# Patient Record
Sex: Male | Born: 1975 | Race: White | Hispanic: No | Smoking: Current every day smoker
Health system: Southern US, Community
[De-identification: ages and names within clinical notes are randomized; demographics above are authoritative.]

## PROBLEM LIST (undated history)

## (undated) DIAGNOSIS — R450 Nervousness: Secondary | ICD-10-CM

## (undated) DIAGNOSIS — I10 Essential (primary) hypertension: Secondary | ICD-10-CM

## (undated) DIAGNOSIS — R2689 Other abnormalities of gait and mobility: Secondary | ICD-10-CM

## (undated) DIAGNOSIS — K279 Peptic ulcer, site unspecified, unspecified as acute or chronic, without hemorrhage or perforation: Secondary | ICD-10-CM

## (undated) DIAGNOSIS — F419 Anxiety disorder, unspecified: Secondary | ICD-10-CM

## (undated) DIAGNOSIS — G8929 Other chronic pain: Secondary | ICD-10-CM

## (undated) HISTORY — DX: Other abnormalities of gait and mobility: R26.89

## (undated) HISTORY — PX: OTHER SURGICAL HISTORY: SHX169

## (undated) HISTORY — PX: JOINT REPLACEMENT: SHX530

## (undated) HISTORY — DX: Other chronic pain: G89.29

## (undated) HISTORY — DX: Nervousness: R45.0

## (undated) HISTORY — PX: HIP SURGERY: SHX245

## (undated) HISTORY — DX: Peptic ulcer, site unspecified, unspecified as acute or chronic, without hemorrhage or perforation: K27.9

## (undated) HISTORY — DX: Essential (primary) hypertension: I10

## (undated) HISTORY — DX: Anxiety disorder, unspecified: F41.9

---

## 1997-09-20 ENCOUNTER — Emergency Department (HOSPITAL_COMMUNITY): Admission: EM | Admit: 1997-09-20 | Discharge: 1997-09-20 | Payer: Self-pay | Admitting: Emergency Medicine

## 2007-11-11 ENCOUNTER — Emergency Department (HOSPITAL_COMMUNITY): Admission: EM | Admit: 2007-11-11 | Discharge: 2007-11-11 | Payer: Self-pay | Admitting: Emergency Medicine

## 2008-10-23 ENCOUNTER — Emergency Department (HOSPITAL_COMMUNITY): Admission: EM | Admit: 2008-10-23 | Discharge: 2008-10-23 | Payer: Self-pay | Admitting: Emergency Medicine

## 2009-12-20 ENCOUNTER — Emergency Department (HOSPITAL_COMMUNITY)
Admission: EM | Admit: 2009-12-20 | Discharge: 2009-12-20 | Payer: Self-pay | Source: Home / Self Care | Admitting: Emergency Medicine

## 2010-03-13 ENCOUNTER — Emergency Department (HOSPITAL_COMMUNITY)
Admission: EM | Admit: 2010-03-13 | Discharge: 2010-03-13 | Payer: Self-pay | Source: Home / Self Care | Admitting: Emergency Medicine

## 2010-04-03 ENCOUNTER — Emergency Department (HOSPITAL_COMMUNITY)
Admission: EM | Admit: 2010-04-03 | Discharge: 2010-04-03 | Disposition: A | Discharge status: Home / Self Care | Payer: Self-pay | Attending: Emergency Medicine | Admitting: Emergency Medicine

## 2010-04-03 DIAGNOSIS — I10 Essential (primary) hypertension: Secondary | ICD-10-CM | POA: Insufficient documentation

## 2010-04-03 DIAGNOSIS — K089 Disorder of teeth and supporting structures, unspecified: Secondary | ICD-10-CM | POA: Insufficient documentation

## 2010-04-07 ENCOUNTER — Emergency Department (HOSPITAL_COMMUNITY)
Admission: EM | Admit: 2010-04-07 | Discharge: 2010-04-07 | Disposition: A | Discharge status: Home / Self Care | Payer: Self-pay | Attending: Emergency Medicine | Admitting: Emergency Medicine

## 2010-04-07 DIAGNOSIS — K089 Disorder of teeth and supporting structures, unspecified: Secondary | ICD-10-CM | POA: Insufficient documentation

## 2010-04-07 DIAGNOSIS — K029 Dental caries, unspecified: Secondary | ICD-10-CM | POA: Insufficient documentation

## 2010-04-07 DIAGNOSIS — I10 Essential (primary) hypertension: Secondary | ICD-10-CM | POA: Insufficient documentation

## 2010-04-09 ENCOUNTER — Emergency Department (HOSPITAL_COMMUNITY)
Admission: EM | Admit: 2010-04-09 | Discharge: 2010-04-09 | Disposition: A | Discharge status: Home / Self Care | Payer: Self-pay | Attending: Emergency Medicine | Admitting: Emergency Medicine

## 2010-04-09 DIAGNOSIS — K089 Disorder of teeth and supporting structures, unspecified: Secondary | ICD-10-CM | POA: Insufficient documentation

## 2010-04-09 DIAGNOSIS — K029 Dental caries, unspecified: Secondary | ICD-10-CM | POA: Insufficient documentation

## 2010-05-09 LAB — POCT I-STAT, CHEM 8
Chloride: 102 mEq/L (ref 96–112)
Creatinine, Ser: 1 mg/dL (ref 0.4–1.5)
Glucose, Bld: 96 mg/dL (ref 70–99)
Potassium: 3.6 mEq/L (ref 3.5–5.1)

## 2010-05-09 LAB — POCT CARDIAC MARKERS: Myoglobin, poc: 96.8 ng/mL (ref 12–200)

## 2010-05-25 ENCOUNTER — Other Ambulatory Visit (HOSPITAL_COMMUNITY): Payer: Self-pay | Admitting: Family Medicine

## 2010-05-25 DIAGNOSIS — M549 Dorsalgia, unspecified: Secondary | ICD-10-CM

## 2010-06-11 ENCOUNTER — Ambulatory Visit (HOSPITAL_COMMUNITY)
Admission: RE | Admit: 2010-06-11 | Discharge: 2010-06-11 | Disposition: A | Discharge status: Home / Self Care | Payer: Self-pay | Source: Ambulatory Visit | Attending: Family Medicine | Admitting: Family Medicine

## 2010-06-11 DIAGNOSIS — M549 Dorsalgia, unspecified: Secondary | ICD-10-CM

## 2010-06-11 DIAGNOSIS — M545 Low back pain, unspecified: Secondary | ICD-10-CM | POA: Insufficient documentation

## 2010-07-31 ENCOUNTER — Emergency Department (HOSPITAL_COMMUNITY): Payer: Medicaid Other

## 2010-07-31 ENCOUNTER — Emergency Department (HOSPITAL_COMMUNITY)
Admission: EM | Admit: 2010-07-31 | Discharge: 2010-07-31 | Disposition: A | Discharge status: Home / Self Care | Payer: Medicaid Other | Attending: Emergency Medicine | Admitting: Emergency Medicine

## 2010-07-31 DIAGNOSIS — M545 Low back pain, unspecified: Secondary | ICD-10-CM | POA: Insufficient documentation

## 2010-07-31 DIAGNOSIS — M1610 Unilateral primary osteoarthritis, unspecified hip: Secondary | ICD-10-CM | POA: Insufficient documentation

## 2010-07-31 DIAGNOSIS — I10 Essential (primary) hypertension: Secondary | ICD-10-CM | POA: Insufficient documentation

## 2010-07-31 DIAGNOSIS — M25559 Pain in unspecified hip: Secondary | ICD-10-CM | POA: Insufficient documentation

## 2010-07-31 DIAGNOSIS — G8929 Other chronic pain: Secondary | ICD-10-CM | POA: Insufficient documentation

## 2010-08-03 ENCOUNTER — Emergency Department (HOSPITAL_COMMUNITY)
Admission: EM | Admit: 2010-08-03 | Discharge: 2010-08-03 | Disposition: A | Discharge status: Home / Self Care | Payer: Medicaid Other | Attending: Emergency Medicine | Admitting: Emergency Medicine

## 2010-08-03 DIAGNOSIS — M169 Osteoarthritis of hip, unspecified: Secondary | ICD-10-CM | POA: Insufficient documentation

## 2010-08-03 DIAGNOSIS — M1610 Unilateral primary osteoarthritis, unspecified hip: Secondary | ICD-10-CM | POA: Insufficient documentation

## 2010-09-18 ENCOUNTER — Encounter: Payer: Self-pay | Admitting: Cardiology

## 2010-09-19 ENCOUNTER — Telehealth: Payer: Self-pay | Admitting: Cardiology

## 2010-09-19 ENCOUNTER — Encounter: Payer: Self-pay | Admitting: Cardiology

## 2010-09-19 ENCOUNTER — Ambulatory Visit (INDEPENDENT_AMBULATORY_CARE_PROVIDER_SITE_OTHER): Payer: Medicaid Other | Admitting: Cardiology

## 2010-09-19 DIAGNOSIS — F17200 Nicotine dependence, unspecified, uncomplicated: Secondary | ICD-10-CM

## 2010-09-19 DIAGNOSIS — I10 Essential (primary) hypertension: Secondary | ICD-10-CM

## 2010-09-19 DIAGNOSIS — R079 Chest pain, unspecified: Secondary | ICD-10-CM

## 2010-09-19 DIAGNOSIS — Z720 Tobacco use: Secondary | ICD-10-CM

## 2010-09-19 MED ORDER — VARENICLINE TARTRATE 1 MG PO TABS: 1.0000 mg | ORAL_TABLET | Freq: Two times a day (BID) | ORAL | Status: AC

## 2010-09-19 MED ORDER — LISINOPRIL 10 MG PO TABS: 10.0000 mg | ORAL_TABLET | Freq: Every day | ORAL | Status: DC

## 2010-09-19 MED ORDER — VARENICLINE TARTRATE 1 MG PO TABS: 1.0000 mg | ORAL_TABLET | Freq: Two times a day (BID) | ORAL | Status: DC

## 2010-09-19 NOTE — Patient Instructions (Signed)
Your physician has requested that you have an exercise tolerance test. For further information please visit [email or web address] Please also follow instruction sheet, as given.  This will be done a [facility].  Your physician discussed the hazards of tobacco use. Tobacco use cessation is recommended and techniques and options to help you quit were discussed.  A prescription for Chantix was sent into [facility]. Please take as directed on the bottle.  Please continue all other medications as listed.

## 2010-09-19 NOTE — Telephone Encounter (Signed)
Pharmacist states pt needs chantix starter pack prescription to be sent to [facility] # [phone]

## 2010-09-19 NOTE — Telephone Encounter (Signed)
[place] pt. 

## 2010-09-19 NOTE — Assessment & Plan Note (Signed)
Greater than 3 minutes discussing smoking cessation. He wants a prescription for Chantix.  He understands all the black box warning and other side effects.  He has no hx of or active depression.

## 2010-09-19 NOTE — Progress Notes (Signed)
HPI For patient presents for evaluation of chest discomfort. This developed on the evening of the 17th of this month.  He developed left-sided discomfort that was somewhat in the arm and slightly into his neck. He did not describe it as heavy or sharp.  It was not like reflux.  He was at rest. He had not had this discomfort before. There were no associated symptoms. It is not overly active because of back problems but he has not been eating prednisone with activity. He presented to the emergency room where EKG was unremarkable and enzymes negative. He refused admission. He has been under quite a bit of emotional stress.  No Known Allergies  Current Outpatient Prescriptions  Medication Sig Dispense Refill  . aspirin 81 MG tablet Take 81 mg by mouth daily.        . cyclobenzaprine (FLEXERIL) 10 MG tablet Take 10 mg by mouth 2 (two) times daily as needed.        . Diazepam (VALIUM PO) Take by mouth.        . Diphenhydramine-APAP, sleep, (GOODYS PM PO) Take by mouth as needed.        . DULoxetine HCl (CYMBALTA PO) Take by mouth.        Marland Kitchen LISINOPRIL PO Take 10 mg by mouth.       . OxyCODONE HCl, Abuse Deter, 5 MG TABS Take by mouth as needed.        . ranitidine (ZANTAC) 150 MG tablet Take 150 mg by mouth daily.          Past Medical History  Diagnosis Date  . HTN (hypertension)   . Anxiety   . Chronic pain     Hip/back  . PUD (peptic ulcer disease)     Past Surgical History  Procedure Date  . Hip surgery     ORIF    Family History  Problem Relation Age of Onset  . Coronary artery disease Paternal Grandfather 59    MI    History   Social History  . Marital Status: Divorced    Spouse Name: N/A    Number of Children: 1  . Years of Education: N/A   Occupational History  . Disabled    Social History Main Topics  . Smoking status: Smoker, Current Status Unknown -- 1.0 packs/day for 14 years    Types: Cigarettes  . Smokeless tobacco: Not on file  . Alcohol Use: No  . Drug  Use: Not on file  . Sexually Active: Not on file   Other Topics Concern  . Not on file   Social History Narrative   Cannabis abuse     ROS:  Headaches, palpitations, reflux, left leg pain and cramping, anxiety. Otherwise as stated in the HPI and negative for all other systems.   PHYSICAL EXAM BP 152/88  Pulse 100  Resp 16  Ht 5\' 10"  (1.778 m)  Wt 159 lb (72.122 kg)  BMI 22.81 kg/m2 GENERAL:  Well appearing HEENT:  Pupils equal round and reactive, fundi not visualized, oral mucosa unremarkable NECK:  No jugular venous distention, waveform within normal limits, carotid upstroke brisk and symmetric, no bruits, no thyromegaly LYMPHATICS:  No cervical, inguinal adenopathy LUNGS:  Clear to auscultation bilaterally BACK:  No CVA tenderness CHEST:  Unremarkable HEART:  PMI not displaced or sustained,S1 and S2 within normal limits, no S3, no S4, no clicks, no rubs, no murmurs ABD:  Flat, positive bowel sounds normal in frequency in pitch, no bruits, no  rebound, no guarding, no midline pulsatile mass, no hepatomegaly, no splenomegaly EXT:  2 plus pulses throughout, no edema, no cyanosis no clubbing SKIN:  No rashes no nodules NEURO:  Cranial nerves II through XII grossly intact, motor grossly intact throughout [facility]:  Cognitively intact, oriented to person place and time   EKG:  09/12/10  Sinus rhythm, rate 86, axis within normal limits, intervals within normal limits, no acute ST-T wave changes, early repolarization  ASSESSMENT AND PLAN

## 2010-09-19 NOTE — Assessment & Plan Note (Signed)
The blood pressure continues to be high. I have instructed the patient to record a blood pressure diary and recording this. This will be presented for my review and pending these results I will make further suggestions about changes in therapy for optimal blood pressure control.  

## 2010-09-19 NOTE — Assessment & Plan Note (Signed)
I think his chest pain is atypical. However, he does have cardiovascular risk factors. Exercise treadmill testing will be indicated. He thinks he would be able to walk on a treadmill. Of note we need to pay particular attention to his blood pressure

## 2010-09-20 ENCOUNTER — Other Ambulatory Visit: Payer: Self-pay | Admitting: Cardiology

## 2010-09-24 DIAGNOSIS — R072 Precordial pain: Secondary | ICD-10-CM

## 2010-09-24 NOTE — Telephone Encounter (Signed)
Discussed with pharmacist on 09/19/2010 - taken care of.

## 2010-10-03 ENCOUNTER — Telehealth: Payer: Self-pay | Admitting: Cardiology

## 2010-10-03 NOTE — Telephone Encounter (Signed)
Pt wants results of stress test done at [facility] [phone]

## 2010-10-03 NOTE — Telephone Encounter (Signed)
Spoke with patient, Stress echo results given.

## 2010-10-08 ENCOUNTER — Encounter: Payer: Self-pay | Admitting: Cardiology

## 2010-10-25 ENCOUNTER — Telehealth: Payer: Self-pay | Admitting: Cardiology

## 2010-10-25 NOTE — Telephone Encounter (Signed)
Status of clearance. Surgery on 9-11. Right hip replacement.

## 2010-10-25 NOTE — Telephone Encounter (Signed)
Called Clydie Braun and let her know he was seen in 08/2010 and had stress test that was normal  We will have Dr Antoine Poche look at them tomorrow and fax the form back to her with his recommendations

## 2010-10-31 ENCOUNTER — Other Ambulatory Visit: Payer: Self-pay | Admitting: Orthopedic Surgery

## 2010-10-31 ENCOUNTER — Encounter (HOSPITAL_COMMUNITY): Payer: Medicaid Other

## 2010-10-31 LAB — SURGICAL PCR SCREEN
MRSA, PCR: POSITIVE — AB
Staphylococcus aureus: POSITIVE — AB

## 2010-10-31 LAB — BASIC METABOLIC PANEL
BUN: 8 mg/dL (ref 6–23)
CO2: 27 mEq/L (ref 19–32)
Calcium: 9.6 mg/dL (ref 8.4–10.5)
Chloride: 103 mEq/L (ref 96–112)
Creatinine, Ser: 0.78 mg/dL (ref 0.50–1.35)

## 2010-10-31 LAB — CBC
MCH: 30.9 pg (ref 26.0–34.0)
MCHC: 33.3 g/dL (ref 30.0–36.0)
MCV: 92.6 fL (ref 78.0–100.0)
Platelets: 258 10*3/uL (ref 150–400)
RBC: 4.6 MIL/uL (ref 4.22–5.81)

## 2010-10-31 LAB — DIFFERENTIAL
Basophils Relative: 0 % (ref 0–1)
Eosinophils Absolute: 0.5 10*3/uL (ref 0.0–0.7)
Eosinophils Relative: 3 % (ref 0–5)
Lymphs Abs: 2.2 10*3/uL (ref 0.7–4.0)
Monocytes Absolute: 0.9 10*3/uL (ref 0.1–1.0)
Monocytes Relative: 7 % (ref 3–12)

## 2010-10-31 LAB — URINALYSIS, ROUTINE W REFLEX MICROSCOPIC
Bilirubin Urine: NEGATIVE
Ketones, ur: NEGATIVE mg/dL
Leukocytes, UA: NEGATIVE
Nitrite: NEGATIVE
Protein, ur: NEGATIVE mg/dL
pH: 6 (ref 5.0–8.0)
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
## 2010-11-06 ENCOUNTER — Inpatient Hospital Stay (HOSPITAL_COMMUNITY): Payer: Medicaid Other

## 2010-11-06 ENCOUNTER — Inpatient Hospital Stay (HOSPITAL_COMMUNITY)
Admission: RE | Admit: 2010-11-06 | Discharge: 2010-11-08 | DRG: 470 | Disposition: A | Discharge status: Home Health | Payer: Medicaid Other | Source: Ambulatory Visit | Attending: Orthopedic Surgery | Admitting: Orthopedic Surgery

## 2010-11-06 DIAGNOSIS — M87059 Idiopathic aseptic necrosis of unspecified femur: Principal | ICD-10-CM | POA: Diagnosis present

## 2010-11-06 DIAGNOSIS — Z79899 Other long term (current) drug therapy: Secondary | ICD-10-CM

## 2010-11-06 DIAGNOSIS — M1610 Unilateral primary osteoarthritis, unspecified hip: Secondary | ICD-10-CM | POA: Diagnosis present

## 2010-11-06 DIAGNOSIS — Z01812 Encounter for preprocedural laboratory examination: Secondary | ICD-10-CM

## 2010-11-06 DIAGNOSIS — M169 Osteoarthritis of hip, unspecified: Secondary | ICD-10-CM | POA: Diagnosis present

## 2010-11-06 DIAGNOSIS — M8970 Major osseous defect, unspecified site: Secondary | ICD-10-CM | POA: Diagnosis present

## 2010-11-06 DIAGNOSIS — G43909 Migraine, unspecified, not intractable, without status migrainosus: Secondary | ICD-10-CM | POA: Diagnosis present

## 2010-11-06 DIAGNOSIS — R030 Elevated blood-pressure reading, without diagnosis of hypertension: Secondary | ICD-10-CM | POA: Diagnosis present

## 2010-11-06 DIAGNOSIS — F411 Generalized anxiety disorder: Secondary | ICD-10-CM | POA: Diagnosis present

## 2010-11-06 DIAGNOSIS — F17200 Nicotine dependence, unspecified, uncomplicated: Secondary | ICD-10-CM | POA: Diagnosis present

## 2010-11-06 LAB — TYPE AND SCREEN: Antibody Screen: NEGATIVE

## 2010-11-07 LAB — CBC
HCT: 39.6 % (ref 39.0–52.0)
Hemoglobin: 12.9 g/dL — ABNORMAL LOW (ref 13.0–17.0)
MCH: 30.4 pg (ref 26.0–34.0)
MCHC: 32.6 g/dL (ref 30.0–36.0)
RDW: 13.6 % (ref 11.5–15.5)

## 2010-11-07 LAB — BASIC METABOLIC PANEL
BUN: 6 mg/dL (ref 6–23)
Calcium: 9.6 mg/dL (ref 8.4–10.5)
GFR calc non Af Amer: >60 mL/min (ref >60)
Glucose, Bld: 115 mg/dL — ABNORMAL HIGH (ref 70–99)
Sodium: 138 mEq/L (ref 135–145)

## 2010-11-08 LAB — BASIC METABOLIC PANEL
Calcium: 9.6 mg/dL (ref 8.4–10.5)
GFR calc Af Amer: >60 mL/min (ref >60)
GFR calc non Af Amer: >60 mL/min (ref >60)
Sodium: 136 mEq/L (ref 135–145)

## 2010-11-08 LAB — CBC
MCH: 30.4 pg (ref 26.0–34.0)
MCHC: 33.4 g/dL (ref 30.0–36.0)
Platelets: 202 10*3/uL (ref 150–400)
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
## 2010-11-12 NOTE — Op Note (Signed)
  NAME:  RHYSE, LOUX NO.:  [id number]  MEDICAL RECORD NO.:  [id number]  LOCATION:  1605                         FACILITY:  [facility]  PHYSICIAN:  Madlyn Frankel. Charlann Boxer, M.D.  DATE OF BIRTH:  Jun 29, 1975  DATE OF PROCEDURE:  11/06/2010 DATE OF DISCHARGE:  11/08/2010                              OPERATIVE REPORT   ADDENDUM:  Physician assistant was utilized for the entire portion of the case, for preoperative position, perioperative positioning and retractor management and general facilitation of the case.  Also directly involved with wound closure.     Madlyn Frankel Charlann Boxer, M.D.     MDO/MEDQ  D:  11/09/2010  T:  11/10/2010  Job:  [id number]  Electronically Signed by Durene Romans M.D. on 11/12/2010 09:49:21 AM
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
## 2011-05-08 ENCOUNTER — Other Ambulatory Visit: Payer: Self-pay | Admitting: Cardiology

## 2011-05-08 DIAGNOSIS — I10 Essential (primary) hypertension: Secondary | ICD-10-CM

## 2011-05-08 MED ORDER — LISINOPRIL 10 MG PO TABS: 10.0000 mg | ORAL_TABLET | Freq: Every day | ORAL | Status: DC

## 2011-07-03 ENCOUNTER — Emergency Department (HOSPITAL_COMMUNITY)
Admission: EM | Admit: 2011-07-03 | Discharge: 2011-07-04 | Disposition: A | Discharge status: Home / Self Care | Payer: Medicaid Other | Attending: Emergency Medicine | Admitting: Emergency Medicine

## 2011-07-03 ENCOUNTER — Encounter (HOSPITAL_COMMUNITY): Payer: Self-pay | Admitting: Family Medicine

## 2011-07-03 DIAGNOSIS — R10819 Abdominal tenderness, unspecified site: Secondary | ICD-10-CM | POA: Insufficient documentation

## 2011-07-03 DIAGNOSIS — R231 Pallor: Secondary | ICD-10-CM | POA: Insufficient documentation

## 2011-07-03 DIAGNOSIS — Z79899 Other long term (current) drug therapy: Secondary | ICD-10-CM | POA: Insufficient documentation

## 2011-07-03 DIAGNOSIS — F17200 Nicotine dependence, unspecified, uncomplicated: Secondary | ICD-10-CM | POA: Insufficient documentation

## 2011-07-03 DIAGNOSIS — R112 Nausea with vomiting, unspecified: Secondary | ICD-10-CM | POA: Insufficient documentation

## 2011-07-03 DIAGNOSIS — R109 Unspecified abdominal pain: Secondary | ICD-10-CM | POA: Insufficient documentation

## 2011-07-03 DIAGNOSIS — F411 Generalized anxiety disorder: Secondary | ICD-10-CM | POA: Insufficient documentation

## 2011-07-03 DIAGNOSIS — I10 Essential (primary) hypertension: Secondary | ICD-10-CM | POA: Insufficient documentation

## 2011-07-03 DIAGNOSIS — R000 Tachycardia, unspecified: Secondary | ICD-10-CM | POA: Insufficient documentation

## 2011-07-03 DIAGNOSIS — M549 Dorsalgia, unspecified: Secondary | ICD-10-CM | POA: Insufficient documentation

## 2011-07-03 LAB — COMPREHENSIVE METABOLIC PANEL
ALT: 7 U/L (ref 0–53)
Alkaline Phosphatase: 61 U/L (ref 39–117)
CO2: 26 mEq/L (ref 19–32)
GFR calc Af Amer: >90 mL/min (ref >90)
Glucose, Bld: 97 mg/dL (ref 70–99)
Potassium: 3.4 mEq/L — ABNORMAL LOW (ref 3.5–5.1)
Sodium: 137 mEq/L (ref 135–145)
Total Protein: 7.7 g/dL (ref 6.0–8.3)

## 2011-07-03 LAB — CBC
Platelets: 246 10*3/uL (ref 150–400)
RBC: 4.67 MIL/uL (ref 4.22–5.81)
WBC: 10.2 10*3/uL (ref 4.0–10.5)

## 2011-07-03 LAB — DIFFERENTIAL
Eosinophils Absolute: 0.2 10*3/uL (ref 0.0–0.7)
Lymphocytes Relative: 29 % (ref 12–46)
Lymphs Abs: 2.9 10*3/uL (ref 0.7–4.0)
Neutrophils Relative %: 61 % (ref 43–77)

## 2011-07-03 MED ORDER — PANTOPRAZOLE SODIUM 40 MG IV SOLR
40.0000 mg | Freq: Once | INTRAVENOUS | Status: AC
  Administered 2011-07-03: 40 mg via INTRAVENOUS
  Filled 2011-07-03: qty 40

## 2011-07-03 MED ORDER — SODIUM CHLORIDE 0.9 % IV SOLN
Freq: Once | INTRAVENOUS | Status: AC
  Administered 2011-07-03: 22:00:00 via INTRAVENOUS

## 2011-07-03 MED ORDER — ONDANSETRON HCL 4 MG/2ML IJ SOLN
4.0000 mg | Freq: Once | INTRAMUSCULAR | Status: AC
  Administered 2011-07-03: 4 mg via INTRAVENOUS
  Filled 2011-07-03: qty 2

## 2011-07-03 MED ORDER — KETOROLAC TROMETHAMINE 30 MG/ML IJ SOLN
15.0000 mg | Freq: Once | INTRAMUSCULAR | Status: AC
  Administered 2011-07-03: 15 mg via INTRAVENOUS
  Filled 2011-07-03: qty 1

## 2011-07-03 MED ORDER — HYDROMORPHONE HCL PF 1 MG/ML IJ SOLN
0.5000 mg | Freq: Once | INTRAMUSCULAR | Status: AC
  Administered 2011-07-03: 0.5 mg via INTRAVENOUS
  Filled 2011-07-03: qty 1

## 2011-07-03 MED ORDER — SODIUM CHLORIDE 0.9 % IV BOLUS (SEPSIS)
250.0000 mL | Freq: Once | INTRAVENOUS | Status: AC
  Administered 2011-07-04: 250 mL via INTRAVENOUS

## 2011-07-03 NOTE — ED Provider Notes (Signed)
History     CSN: [id number]  Arrival date & time 07/03/11  1805   First MD Initiated Contact with Patient 07/03/11 2140      Chief Complaint  Patient presents with  . Abdominal Pain  . Nausea  . Emesis    (Consider location/radiation/quality/duration/timing/severity/associated sxs/prior treatment) HPI Comments: Patient states he has a history of gastric ulcer disease.  Has not been on treatment in a while, but has noticed over the last several months.  He has had increasing discomfort in the left upper quadrant and epigastric area, associated with nausea.  She reports that to 3 weeks ago.  He had a week of diarrhea, nausea, and increased pain for the past week.  He has had daily episodes of vomiting bilious in appearance.  He has been unable to establish care in [place] to 2 providers not accepting his type of insurance.   Patient is a 36 y.o. male presenting with abdominal pain and vomiting. The history is provided by the patient.  Abdominal Pain The primary symptoms of the illness include abdominal pain, nausea and vomiting. The primary symptoms of the illness do not include diarrhea or dysuria. The current episode started more than 2 days ago. The onset of the illness was gradual. The problem has been gradually worsening.  The abdominal pain began more than 2 days ago. The pain came on suddenly. The abdominal pain has been gradually worsening since its onset. The abdominal pain is located in the LUQ. The abdominal pain radiates to the back. The severity of the abdominal pain is 6/10. The abdominal pain is relieved by nothing. The abdominal pain is exacerbated by vomiting.  Vomiting occurs 6 to 10 times per day. The emesis contains bilious material.  The patient has had a change in bowel habit. Additional symptoms associated with the illness include back pain. Symptoms associated with the illness do not include chills, anorexia, diaphoresis or constipation. Significant  associated medical issues include PUD.  Emesis  Associated symptoms include abdominal pain. Pertinent negatives include no chills and no diarrhea.    Past Medical History  Diagnosis Date  . HTN (hypertension)   . Anxiety   . Chronic pain     Hip/back  . PUD (peptic ulcer disease)     Past Surgical History  Procedure Date  . Hip surgery     ORIF    Family History  Problem Relation Age of Onset  . Coronary artery disease Paternal Grandfather 59    MI    History  Substance Use Topics  . Smoking status: Smoker, Current Status Unknown -- 1.0 packs/day for 14 years    Types: Cigarettes  . Smokeless tobacco: Not on file  . Alcohol Use: No      Review of Systems  Constitutional: Negative for chills and diaphoresis.  HENT: Negative for neck stiffness.   Gastrointestinal: Positive for nausea, vomiting and abdominal pain. Negative for diarrhea, constipation, blood in stool and anorexia.  Genitourinary: Negative for dysuria and flank pain.  Musculoskeletal: Positive for back pain.  Neurological: Negative for weakness.    Allergies  Hydrocodone-acetaminophen and Morphine and related  Home Medications   Current Outpatient Rx  Name Route Sig Dispense Refill  . ALPRAZOLAM 1 MG PO TABS Oral Take 1 mg by mouth 2 (two) times daily as needed. For anxiety    . ASPIRIN 81 MG PO TABS Oral Take 81 mg by mouth daily.      Marland Kitchen DIAZEPAM 10 MG  PO TABS Oral Take 10 mg by mouth every 6 (six) hours as needed. For anxiety    . LISINOPRIL 10 MG PO TABS Oral Take 1 tablet (10 mg total) by mouth daily. 30 tablet 6  . RANITIDINE HCL 150 MG PO TABS Oral Take 150 mg by mouth 2 (two) times daily.    . SUCRALFATE 1 G PO TABS Oral Take 1 tablet (1 g total) by mouth 4 (four) times daily. 90 tablet 0    BP 121/72  Pulse 66  Temp(Src) 98.2 F (36.8 C) (Oral)  Resp 22  SpO2 98%  Physical Exam  Constitutional: He appears well-developed and well-nourished.  HENT:  Head: Normocephalic.  Eyes:  Pupils are equal, round, and reactive to light.  Neck: Normal range of motion.  Cardiovascular: Tachycardia present.   Pulmonary/Chest: Effort normal.  Abdominal: Bowel sounds are normal. He exhibits no distension. There is tenderness.    Musculoskeletal: Normal range of motion.  Neurological: He is alert.  Skin: Skin is warm and dry. There is pallor.    ED Course  Procedures (including critical care time)  Labs Reviewed  COMPREHENSIVE METABOLIC PANEL - Abnormal; Notable for the following:    Potassium 3.4 (*)    Total Bilirubin 0.2 (*)    All other components within normal limits  CBC  DIFFERENTIAL  LIPASE, BLOOD   No results found.   1. Abdominal pain     Discussed the findings with patient after IV hydration, and Protonix.,  Zofran, and morphine he is feeling, better  MDM  Concern for GB disease or Pancreatitis  Denies ETOH use in the past 4 years         Arman Filter, NP 07/04/11 0255

## 2011-07-03 NOTE — ED Notes (Signed)
Patient states he has a history of stomach ulcers. States he started having abdominal pain and n/v since last night. Pain has gotten worse. Unable to keep down food or liquids.

## 2011-07-03 NOTE — ED Notes (Signed)
ALP bedside 

## 2011-07-03 NOTE — ED Notes (Signed)
Family member approached RN at window and states "since he's been triaged he's been to the BR to throw up 6 times." RN informed family member that staff is awaiting for room to be cleaned and he will be taken to room to be examined by EDP.

## 2011-07-04 MED ORDER — SUCRALFATE 1 G PO TABS
1.0000 g | ORAL_TABLET | Freq: Once | ORAL | Status: AC
  Administered 2011-07-04: 1 g via ORAL
  Filled 2011-07-04: qty 1

## 2011-07-04 MED ORDER — SUCRALFATE 1 G PO TABS: 1.0000 g | ORAL_TABLET | Freq: Four times a day (QID) | ORAL | Status: DC

## 2011-07-04 NOTE — Discharge Instructions (Signed)
Abdominal Pain (Nonspecific) Your exam might not show the exact reason you have abdominal pain. Since there are many different causes of abdominal pain, another checkup and more tests may be needed. It is very important to follow up for lasting (persistent) or worsening symptoms. A possible cause of abdominal pain in any person who still has his or her appendix is acute appendicitis. Appendicitis is often hard to diagnose. Normal blood tests, urine tests, ultrasound, and CT scans do not completely rule out early appendicitis or other causes of abdominal pain. Sometimes, only the changes that happen over time will allow appendicitis and other causes of abdominal pain to be determined. Other potential problems that may require surgery may also take time to become more apparent. Because of this, it is important that you follow all of the instructions below. HOME CARE INSTRUCTIONS   Rest as much as possible.   Do not eat solid food until your pain is gone.   While adults or children have pain: A diet of water, weak decaffeinated tea, broth or bouillon, gelatin, oral rehydration solutions (ORS), frozen ice pops, or ice chips may be helpful.   When pain is gone in adults or children: Start a light diet (dry toast, crackers, applesauce, or white rice). Increase the diet slowly as long as it does not bother you. Eat no dairy products (including cheese and eggs) and no spicy, fatty, fried, or high-fiber foods.   Use no alcohol, caffeine, or cigarettes.   Take your regular medicines unless your caregiver told you not to.   Take any prescribed medicine as directed.   Only take over-the-counter or prescription medicines for pain, discomfort, or fever as directed by your caregiver. Do not give aspirin to children.  If your caregiver has given you a follow-up appointment, it is very important to keep that appointment. Not keeping the appointment could result in a permanent injury and/or lasting (chronic) pain  and/or disability. If there is any problem keeping the appointment, you must call to reschedule.  SEEK IMMEDIATE MEDICAL CARE IF:   Your pain is not gone in 24 hours.   Your pain becomes worse, changes location, or feels different.   You or your child has an oral temperature above 102 F (38.9 C), not controlled by medicine.   Your baby is older than 3 months with a rectal temperature of 102 F (38.9 C) or higher.   Your baby is 19 months old or younger with a rectal temperature of 100.4 F (38 C) or higher.   You have shaking chills.   You keep throwing up (vomiting) or cannot drink liquids.   There is blood in your vomit or you see blood in your bowel movements.   Your bowel movements become dark or black.   You have frequent bowel movements.   Your bowel movements stop (become blocked) or you cannot pass gas.   You have bloody, frequent, or painful urination.   You have yellow discoloration in the skin or whites of the eyes.   Your stomach becomes bloated or bigger.   You have dizziness or fainting.   You have chest or back pain.  MAKE SURE YOU:   Understand these instructions.   Will watch your condition.   Will get help right away if you are not doing well or get worse.  Document Released: 02/11/2005 Document Revised: 01/31/2011 Document Reviewed: 01/09/2009 [facility] Patient Information 2012 [place], [place]. Been given a referral to Dr. Ewing Schlein of [facility]. I have also  be given you a prescription for a medication called Carafate to take this before meals and at bedtime.

## 2011-07-04 NOTE — ED Provider Notes (Signed)
Medical screening examination/treatment/procedure(s) were performed by non-physician practitioner and as supervising physician I was immediately available for consultation/collaboration.  Cheri Guppy, MD 07/04/11 [phone]

## 2011-09-04 ENCOUNTER — Encounter (HOSPITAL_COMMUNITY): Payer: Self-pay | Admitting: *Deleted

## 2011-09-04 ENCOUNTER — Emergency Department (HOSPITAL_COMMUNITY)
Admission: EM | Admit: 2011-09-04 | Discharge: 2011-09-04 | Disposition: A | Discharge status: Home / Self Care | Payer: Medicaid Other | Attending: Emergency Medicine | Admitting: Emergency Medicine

## 2011-09-04 ENCOUNTER — Emergency Department (HOSPITAL_COMMUNITY): Payer: Medicaid Other

## 2011-09-04 DIAGNOSIS — G8929 Other chronic pain: Secondary | ICD-10-CM | POA: Insufficient documentation

## 2011-09-04 DIAGNOSIS — K279 Peptic ulcer, site unspecified, unspecified as acute or chronic, without hemorrhage or perforation: Secondary | ICD-10-CM | POA: Insufficient documentation

## 2011-09-04 DIAGNOSIS — M25559 Pain in unspecified hip: Secondary | ICD-10-CM | POA: Insufficient documentation

## 2011-09-04 DIAGNOSIS — F411 Generalized anxiety disorder: Secondary | ICD-10-CM | POA: Insufficient documentation

## 2011-09-04 DIAGNOSIS — I10 Essential (primary) hypertension: Secondary | ICD-10-CM | POA: Insufficient documentation

## 2011-09-04 MED ORDER — OXYCODONE-ACETAMINOPHEN 10-325 MG PO TABS: 1.0000 | ORAL_TABLET | Freq: Three times a day (TID) | ORAL | Status: AC | PRN

## 2011-09-04 MED ORDER — OXYCODONE-ACETAMINOPHEN 5-325 MG PO TABS
2.0000 | ORAL_TABLET | Freq: Once | ORAL | Status: AC
  Administered 2011-09-04: 2 via ORAL
  Filled 2011-09-04: qty 2

## 2011-09-04 NOTE — ED Provider Notes (Signed)
History     CSN: [id number]  Arrival date & time 09/04/11  2039   First MD Initiated Contact with Patient 09/04/11 2200      Chief Complaint  Patient presents with  . Hip Pain   HPI  History provided by the patient. Patient is a 36 year old male with history of hypertension, prior right hip and pelvis fracture with chronic pains who presents with complaints of persistent and increasing pain to the right hip area for the past months. Patient states that pain has been increasing and he feels some not and protuberance of metal rod and hip area. Patient denies having any new injury or trauma to the area. Pain has been increasing and making it difficult to walk. Patient states he has recently had use a cane to walk. Patient has been still working and works in [employer]. 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 Pain is worse after working during the day. Patient states pain is the point he can no longer take it. Patient does report having an appointment with his orthopedic specialist, Dr. Charlann Cole on Monday. Patient denies any other symptoms. Denies any weakness in the foot.    Past Medical History  Diagnosis Date  . HTN (hypertension)   . Anxiety   . Chronic pain     Hip/back  . PUD (peptic ulcer disease)     Past Surgical History  Procedure Date  . Hip surgery     ORIF    Family History  Problem Relation Age of Onset  . Coronary artery disease Paternal Grandfather 48    MI    History  Substance Use Topics  . Smoking status: Smoker, Current Status Unknown -- 1.0 packs/day for 14 years    Types: Cigarettes  . Smokeless tobacco: Not on file  . Alcohol Use: No      Review of Systems  Respiratory: Negative for shortness of breath.   Cardiovascular: Negative for chest pain.  Gastrointestinal: Negative for abdominal pain.  Musculoskeletal: Negative for back pain and joint swelling.       Pelvis and hip pain  Neurological: Negative for  weakness.    Allergies  Hydrocodone-acetaminophen and Morphine and related  Home Medications   Current Outpatient Rx  Name Route Sig Dispense Refill  . ALPRAZOLAM 1 MG PO TABS Oral Take 1 mg by mouth 2 (two) times daily as needed. For anxiety    . ASPIRIN 81 MG PO TABS Oral Take 81 mg by mouth daily.      Marland Kitchen DIAZEPAM 10 MG PO TABS Oral Take 10 mg by mouth every 6 (six) hours as needed. For anxiety    . LISINOPRIL 10 MG PO TABS Oral Take 1 tablet (10 mg total) by mouth daily. 30 tablet 6  . OMEPRAZOLE 20 MG PO CPDR Oral Take 20 mg by mouth daily.    . SUCRALFATE 1 G PO TABS Oral Take 1 tablet (1 g total) by mouth 4 (four) times daily. 90 tablet 0    BP 114/70  Pulse 100  Temp 97.5 F (36.4 C) (Oral)  Resp 18  SpO2 100%  Physical Exam  Nursing note and vitals reviewed. Constitutional: He is oriented to person, place, and time. He appears well-developed and well-nourished. No distress.  HENT:  Head: Normocephalic.  Cardiovascular: Normal rate and regular rhythm.   Pulmonary/Chest: Effort normal and breath sounds normal.  Abdominal: Soft. There is no tenderness.  Musculoskeletal:  Surgical scars over right hip consistent with history of prior surgery. There is tenderness to palpation around the joint area. No significant swelling and no skin changes. Normal femoral pulses. Normal range of motion of the knees and lower joints. Normal dorsal pedal pulses and movement in toes. Cap refill less than 2 seconds.  Neurological: He is alert and oriented to person, place, and time.  Skin: Skin is warm. No erythema.  Psychiatric: He has a normal mood and affect. His behavior is normal.    ED Course  Procedures    Dg Hip Complete Right  09/04/2011  *RADIOLOGY REPORT*  Clinical Data: Right hip pain with right leg and foot numbness. Previous right total hip prosthesis insertion.  RIGHT HIP - COMPLETE 2+ VIEW  Comparison: Radiographs dated 11/06/2010 and 03/13/2010  Findings: Right  total hip prosthesis components appear in good position.  No fracture or dislocation.  Old deformity of the right hemi pelvis due to old well healed fractures.  Plates and screws in the right ilium.  IMPRESSION: No acute abnormalities.  Original Report Authenticated By: Gwynn Burly, M.D.     1. Chronic hip pain       MDM  11:00 PM patient seen and evaluated. Patient in no acute distress. Patient with unremarkable x-rays without acute findings of fracture or dislocation.        Angus Seller, [place] 09/05/11 [phone]

## 2011-09-04 NOTE — ED Notes (Signed)
Rt hip pain for months.  He has an appointment  With his othro doctor tomorrow.  He has sl slurred speech.  Numbness in his rt leg

## 2011-09-04 NOTE — ED Notes (Signed)
Pt states that the surgery he had in 98 put plates in his right hip and feels like the plates are "trying to poke out." Pt has appt tomorrow with surgeon and he states that the pain was too bad for him today.

## 2011-09-04 NOTE — ED Notes (Signed)
He has had 2 hip surgeries with plates rods and screws

## 2011-09-05 NOTE — ED Provider Notes (Signed)
Medical screening examination/treatment/procedure(s) were performed by non-physician practitioner and as supervising physician I was immediately available for consultation/collaboration.  Geoffery Lyons, MD 09/05/11 [phone]

## 2011-10-05 ENCOUNTER — Emergency Department (HOSPITAL_BASED_OUTPATIENT_CLINIC_OR_DEPARTMENT_OTHER): Payer: Medicaid Other

## 2011-10-05 ENCOUNTER — Encounter (HOSPITAL_BASED_OUTPATIENT_CLINIC_OR_DEPARTMENT_OTHER): Payer: Self-pay | Admitting: *Deleted

## 2011-10-05 ENCOUNTER — Emergency Department (HOSPITAL_BASED_OUTPATIENT_CLINIC_OR_DEPARTMENT_OTHER)
Admission: EM | Admit: 2011-10-05 | Discharge: 2011-10-05 | Disposition: A | Discharge status: Home / Self Care | Payer: Medicaid Other | Attending: Emergency Medicine | Admitting: Emergency Medicine

## 2011-10-05 DIAGNOSIS — S59919A Unspecified injury of unspecified forearm, initial encounter: Secondary | ICD-10-CM | POA: Insufficient documentation

## 2011-10-05 DIAGNOSIS — Y92009 Unspecified place in unspecified non-institutional (private) residence as the place of occurrence of the external cause: Secondary | ICD-10-CM | POA: Insufficient documentation

## 2011-10-05 DIAGNOSIS — S6990XA Unspecified injury of unspecified wrist, hand and finger(s), initial encounter: Secondary | ICD-10-CM | POA: Insufficient documentation

## 2011-10-05 DIAGNOSIS — S59909A Unspecified injury of unspecified elbow, initial encounter: Secondary | ICD-10-CM | POA: Insufficient documentation

## 2011-10-05 DIAGNOSIS — I10 Essential (primary) hypertension: Secondary | ICD-10-CM | POA: Insufficient documentation

## 2011-10-05 DIAGNOSIS — F10929 Alcohol use, unspecified with intoxication, unspecified: Secondary | ICD-10-CM

## 2011-10-05 DIAGNOSIS — T07XXXA Unspecified multiple injuries, initial encounter: Secondary | ICD-10-CM

## 2011-10-05 DIAGNOSIS — W1809XA Striking against other object with subsequent fall, initial encounter: Secondary | ICD-10-CM | POA: Insufficient documentation

## 2011-10-05 DIAGNOSIS — G8929 Other chronic pain: Secondary | ICD-10-CM | POA: Insufficient documentation

## 2011-10-05 DIAGNOSIS — F17200 Nicotine dependence, unspecified, uncomplicated: Secondary | ICD-10-CM | POA: Insufficient documentation

## 2011-10-05 NOTE — ED Notes (Signed)
Returned from xray

## 2011-10-05 NOTE — ED Notes (Signed)
D/C instructions given to pt. He requested narcotic pain mediation. Pt. Smells of etoh and slurs his speech. MD aware of pt's request for pain meds/RX and no further orders received. Pt. Aware that we will not be given any meds or RX for pain.  Pt. Yelling and refused to sign his d/c paperwork and refused to be assisted to his car in a w/c. Pt. Does have a ride home.

## 2011-10-05 NOTE — ED Provider Notes (Signed)
History     CSN: [id number]  Arrival date & time 10/05/11  0123   First MD Initiated Contact with Patient 10/05/11 [phone]      Chief Complaint  Patient presents with  . Arm Injury    (Consider location/radiation/quality/duration/timing/severity/associated sxs/prior treatment) HPI Level 5 Caveat: intoxicated. This is a 36 year old white male who admits to drinking alcohol this morning. He fell into a bookcase at home and now has pain in his right forearm and right hand. Specifically his worse pain is in the right fifth metacarpal. He has some scattered abrasions and ecchymoses of the right forearm and right fourth finger. There is no motor or sensory deficit range of motion is limited due to pain.  Past Medical History  Diagnosis Date  . HTN (hypertension)   . Anxiety   . Chronic pain     Hip/back  . PUD (peptic ulcer disease)     Past Surgical History  Procedure Date  . Hip surgery     ORIF    Family History  Problem Relation Age of Onset  . Coronary artery disease Paternal Grandfather 51    MI    History  Substance Use Topics  . Smoking status: Smoker, Current Status Unknown -- 1.0 packs/day for 14 years    Types: Cigarettes  . Smokeless tobacco: Not on file  . Alcohol Use: Yes      Review of Systems  All other systems reviewed and are negative.    Allergies  Hydrocodone-acetaminophen and Morphine and related  Home Medications   Current Outpatient Rx  Name Route Sig Dispense Refill  . ALPRAZOLAM 1 MG PO TABS Oral Take 1 mg by mouth 2 (two) times daily as needed. For anxiety    . ASPIRIN 81 MG PO TABS Oral Take 81 mg by mouth daily.      Marland Kitchen DIAZEPAM 10 MG PO TABS Oral Take 10 mg by mouth every 6 (six) hours as needed. For anxiety    . LISINOPRIL 10 MG PO TABS Oral Take 1 tablet (10 mg total) by mouth daily. 30 tablet 6  . OMEPRAZOLE 20 MG PO CPDR Oral Take 20 mg by mouth daily.    . SUCRALFATE 1 G PO TABS Oral Take 1 tablet (1 g total) by mouth 4 (four)  times daily. 90 tablet 0    BP 117/77  Pulse 99  Temp 97.6 F (36.4 C) (Oral)  Resp 18  Ht 5\' 10"  (1.778 m)  Wt 170 lb (77.111 kg)  BMI 24.39 kg/m2  SpO2 98%  Physical Exam General: Well-developed, well-nourished male in no acute distress; appearance consistent with age of record HENT: normocephalic, atraumatic; breath smells of alcohol Eyes: pupils equal round and reactive to light; extraocular muscles intact Neck: supple Heart: regular rate and rhythm Lungs: Normal respiratory effort and excursion Abdomen: soft; nondistended Extremities: No deformity; tenderness over right fifth metacarpal with decreased range of motion of fingers of the right hand due to pain; fingers distally neurovascularly intact with intact tendon function; no snuff box tenderness; abrasions and ecchymoses of right forearm and right fourth finger Neurologic: Awake, alert; motor function intact in all extremities and symmetric; no facial droop; dysarthric; ataxic Skin: Warm and dry     ED Course  Procedures (including critical care time)     MDM   Nursing notes and vitals signs, including pulse oximetry, reviewed.  Summary of this visit's results, reviewed by myself:   Imaging Studies: Dg Forearm Right  10/05/2011  *RADIOLOGY REPORT*  Clinical Data: Right arm pain  RIGHT FOREARM - 2 VIEW  Comparison: None.  Findings: No fracture of the radius or ulna.  The elbow joint and wrist joint are normal.  IMPRESSION: No fracture or  dislocation of forearm.  Original Report Authenticated By: Genevive Bi, M.D.   Dg Hand Complete Right  10/05/2011  *RADIOLOGY REPORT*  Clinical Data: Right arm pain  RIGHT HAND - COMPLETE 3+ VIEW  Comparison: none  Findings:  The radiocarpal joint is normal.   No evidence of carpal fracture.  No metacarpal fracture.  IMPRESSION: No evidence of wrist fracture.  Original Report Authenticated By: Genevive Bi, M.D.            Hanley Seamen, MD 10/05/11 [phone]

## 2011-10-05 NOTE — ED Notes (Signed)
Patient transported to X-ray 

## 2011-10-05 NOTE — ED Notes (Signed)
+  ETOH, pt fell into bookcase and now c/o right arm pain and abrasion. Tetanus UTD

## 2012-02-11 ENCOUNTER — Emergency Department (HOSPITAL_COMMUNITY): Payer: Medicaid Other

## 2012-02-11 ENCOUNTER — Encounter (HOSPITAL_COMMUNITY): Payer: Self-pay

## 2012-02-11 ENCOUNTER — Emergency Department (HOSPITAL_COMMUNITY)
Admission: EM | Admit: 2012-02-11 | Discharge: 2012-02-11 | Disposition: A | Discharge status: Home / Self Care | Payer: Medicaid Other | Attending: Emergency Medicine | Admitting: Emergency Medicine

## 2012-02-11 DIAGNOSIS — Z79899 Other long term (current) drug therapy: Secondary | ICD-10-CM | POA: Insufficient documentation

## 2012-02-11 DIAGNOSIS — K089 Disorder of teeth and supporting structures, unspecified: Secondary | ICD-10-CM | POA: Insufficient documentation

## 2012-02-11 DIAGNOSIS — I10 Essential (primary) hypertension: Secondary | ICD-10-CM | POA: Insufficient documentation

## 2012-02-11 DIAGNOSIS — G8929 Other chronic pain: Secondary | ICD-10-CM | POA: Insufficient documentation

## 2012-02-11 DIAGNOSIS — Z8719 Personal history of other diseases of the digestive system: Secondary | ICD-10-CM | POA: Insufficient documentation

## 2012-02-11 DIAGNOSIS — F411 Generalized anxiety disorder: Secondary | ICD-10-CM | POA: Insufficient documentation

## 2012-02-11 DIAGNOSIS — F17200 Nicotine dependence, unspecified, uncomplicated: Secondary | ICD-10-CM | POA: Insufficient documentation

## 2012-02-11 DIAGNOSIS — K0889 Other specified disorders of teeth and supporting structures: Secondary | ICD-10-CM

## 2012-02-11 LAB — CBC WITH DIFFERENTIAL/PLATELET
HCT: 41.7 % (ref 39.0–52.0)
Hemoglobin: 14.6 g/dL (ref 13.0–17.0)
Lymphs Abs: 2.6 10*3/uL (ref 0.7–4.0)
MCH: 31.3 pg (ref 26.0–34.0)
Monocytes Absolute: 0.6 10*3/uL (ref 0.1–1.0)
Monocytes Relative: 6 % (ref 3–12)
Neutro Abs: 6 10*3/uL (ref 1.7–7.7)
Neutrophils Relative %: 64 % (ref 43–77)
RBC: 4.66 MIL/uL (ref 4.22–5.81)

## 2012-02-11 LAB — BASIC METABOLIC PANEL
BUN: 14 mg/dL (ref 6–23)
Chloride: 100 mEq/L (ref 96–112)
Glucose, Bld: 98 mg/dL (ref 70–99)
Potassium: 3.8 mEq/L (ref 3.5–5.1)

## 2012-02-11 MED ORDER — AMOXICILLIN 500 MG PO CAPS: 500.0000 mg | ORAL_CAPSULE | Freq: Three times a day (TID) | ORAL | Status: AC

## 2012-02-11 MED ORDER — HYDROMORPHONE HCL PF 1 MG/ML IJ SOLN
1.0000 mg | Freq: Once | INTRAMUSCULAR | Status: AC
  Administered 2012-02-11: 1 mg via INTRAVENOUS

## 2012-02-11 MED ORDER — OXYCODONE-ACETAMINOPHEN 5-325 MG PO TABS
2.0000 | ORAL_TABLET | Freq: Once | ORAL | Status: AC
  Administered 2012-02-11: 2 via ORAL
  Filled 2012-02-11: qty 2

## 2012-02-11 MED ORDER — HYDROMORPHONE HCL PF 1 MG/ML IJ SOLN: 1.0000 mg | Freq: Once | INTRAMUSCULAR | Status: DC

## 2012-02-11 MED ORDER — IOHEXOL 300 MG/ML  SOLN
75.0000 mL | Freq: Once | INTRAMUSCULAR | Status: AC | PRN
  Administered 2012-02-11: 75 mL via INTRAVENOUS

## 2012-02-11 MED ORDER — HYDROMORPHONE HCL PF 1 MG/ML IJ SOLN
INTRAMUSCULAR | Status: AC
  Filled 2012-02-11: qty 1

## 2012-02-11 MED ORDER — OXYCODONE-ACETAMINOPHEN 10-325 MG PO TABS: 1.0000 | ORAL_TABLET | ORAL | Status: DC | PRN

## 2012-02-11 MED ORDER — HYDROMORPHONE HCL PF 1 MG/ML IJ SOLN
2.0000 mg | Freq: Once | INTRAMUSCULAR | Status: DC
  Filled 2012-02-11: qty 1

## 2012-02-11 MED ORDER — OXYCODONE-ACETAMINOPHEN 5-325 MG PO TABS: 2.0000 | ORAL_TABLET | ORAL | Status: DC | PRN

## 2012-02-11 NOTE — ED Notes (Signed)
Departure condition put in on wrong patient.

## 2012-02-11 NOTE — ED Notes (Signed)
Pt states his dentist was scrapping infection from the right side of his jaw

## 2012-02-11 NOTE — ED Notes (Signed)
Patient with no complaints at this time. Respirations even and unlabored. Skin warm/dry. Discharge instructions reviewed with patient at this time. Patient given opportunity to voice concerns/ask questions. IV removed per policy and band-aid applied to site. Patient discharged at this time and left Emergency Department with steady gait.  

## 2012-02-13 NOTE — ED Provider Notes (Signed)
History     CSN: [id number]  Arrival date & time 02/11/12  1647   First MD Initiated Contact with Patient 02/11/12 1708      Chief Complaint  Patient presents with  . Jaw Pain    (Consider location/radiation/quality/duration/timing/severity/associated sxs/prior treatment) HPI Comments: Eddie Cole presents with a post surgical complication involving pain and increased loss of gingiva surrounding a dental extraction 4 days ago by his oral surgeon in [facility].  He was told there was surrounding infection at the time of the extraction and the surgeon has to "scrap infection from his jawbone".  The small socket hole has enlarged,  He can now see and feel his lateral mandible below this extraction site.  He has increased pain,  There is no drainage from the site and he is taking amoxil for infection.  He contacted his oral surgeon before arrival here who recommended ct scanning to assess for bone infection.  He denies fevers and chills, but he does have swelling of his lymph node under his jaw.  He has taken oxycodone but has run out of this medicine.  The history is provided by the patient.    Past Medical History  Diagnosis Date  . HTN (hypertension)   . Anxiety   . Chronic pain     Hip/back  . PUD (peptic ulcer disease)     Past Surgical History  Procedure Date  . Hip surgery     ORIF    Family History  Problem Relation Age of Onset  . Coronary artery disease Paternal Grandfather 72    MI    History  Substance Use Topics  . Smoking status: Smoker, Current Status Unknown -- 1.0 packs/day for 14 years    Types: Cigarettes  . Smokeless tobacco: Not on file  . Alcohol Use: Yes      Review of Systems  Constitutional: Negative for fever.  HENT: Positive for dental problem. Negative for sore throat, facial swelling, neck pain and neck stiffness.   Respiratory: Negative for shortness of breath.     Allergies  Morphine and related  Home Medications    Current Outpatient Rx  Name  Route  Sig  Dispense  Refill  . AMOXICILLIN 500 MG PO CAPS   Oral   Take 500 mg by mouth 4 (four) times daily.         Marland Kitchen DIAZEPAM 10 MG PO TABS   Oral   Take 10 mg by mouth 4 (four) times daily as needed. For anxiety         . LISINOPRIL 10 MG PO TABS   Oral   Take 1 tablet (10 mg total) by mouth daily.   30 tablet   6   . OMEPRAZOLE 20 MG PO CPDR   Oral   Take 20 mg by mouth daily.         . OXYCODONE HCL 5 MG PO CAPS   Oral   Take 5 mg by mouth 4 (four) times daily as needed. For 4 days for post dental procedural pain         . SUCRALFATE 1 G PO TABS   Oral   Take 1 g by mouth 2 (two) times daily.         . AMOXICILLIN 500 MG PO CAPS   Oral   Take 1 capsule (500 mg total) by mouth 3 (three) times daily.   15 capsule   0   . OXYCODONE-ACETAMINOPHEN 10-325 MG PO TABS  Oral   Take 1 tablet by mouth every 4 (four) hours as needed for pain.   30 tablet   0   . OXYCODONE-ACETAMINOPHEN 5-325 MG PO TABS   Oral   Take 2 tablets by mouth every 4 (four) hours as needed for pain.   6 tablet   0     BP 139/95  Pulse 96  Temp 97.9 F (36.6 C) (Oral)  Resp 20  Ht 5\' 10"  (1.778 m)  Wt 170 lb (77.111 kg)  BMI 24.39 kg/m2  SpO2 99%  Physical Exam  Constitutional: He is oriented to person, place, and time. He appears well-developed and well-nourished. No distress.  HENT:  Head: Normocephalic and atraumatic.  Right Ear: Tympanic membrane and external ear normal.  Left Ear: Tympanic membrane and external ear normal.  Mouth/Throat: Oropharynx is clear and moist and mucous membranes are normal. No oral lesions. Abnormal dentition.    Eyes: Conjunctivae normal are normal.  Neck: Normal range of motion. Neck supple.  Cardiovascular: Normal rate and normal heart sounds.   Pulmonary/Chest: Effort normal.  Abdominal: He exhibits no distension.  Musculoskeletal: Normal range of motion.  Lymphadenopathy:    He has no cervical  adenopathy.  Neurological: He is alert and oriented to person, place, and time.  Skin: Skin is warm and dry. No erythema.  Psychiatric: He has a normal mood and affect.    ED Course  Procedures (including critical care time)   Labs Reviewed  CBC WITH DIFFERENTIAL  BASIC METABOLIC PANEL  LAB REPORT - SCANNED   Ct Maxillofacial W/cm  02/11/2012  *RADIOLOGY REPORT*  Clinical Data: Pain in jaw.  Recent oral surgery.  CT MAXILLOFACIAL WITH CONTRAST  Technique:  Multidetector CT imaging of the maxillofacial structures was performed with intravenous contrast. Multiplanar CT image reconstructions were also generated.  Contrast: 75mL OMNIPAQUE IOHEXOL 300 MG/ML  SOLN  Comparison: None.  Findings: A 2.0 by 1.2 by 2.3 cm bony defect is present in the right mandible, and includes the absence right medial mandibular molar.  The outer cortex of the mandible is absent along this defect, and there is mixed gas density in the tooth socket.  The bony defect appears probably involve the mandibular canal just posterior to the mental foramen as shown on images 26-33 of series 7.  There is also some mild periapical lucency associated with the middle right mandibular molar which extends to this bony defect is shown on images 28-29 of series 7.  The paranasal sinuses appear essentially clear.  No other significant abnormalities observed.  IMPRESSION:  1.  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  Lucency associated with the bony defect, which involves the lateral mandibular cortex, also appears to extend to the apex of the middle right mandibular molar and also to the mandibular canal.  The bony defect may be postoperative, infectious, or both.   Original Report Authenticated By: Gaylyn Rong, M.D.      1. Pain, dental       MDM  Ct scan reviewed.  Disk given for pt to take to oral surgeon.  Pt to continue amoxil,  Prescribed oxycodone.  To call oral surgeon  tomorrow for f/u.        Burgess Amor, [place] 02/13/12 1221

## 2012-02-15 NOTE — ED Provider Notes (Signed)
Medical screening examination/treatment/procedure(s) were performed by non-physician practitioner and as supervising physician I was immediately available for consultation/collaboration.   Dione Booze, MD 02/15/12 [phone]

## 2012-02-26 ENCOUNTER — Encounter (HOSPITAL_COMMUNITY): Payer: Self-pay | Admitting: *Deleted

## 2012-02-26 ENCOUNTER — Emergency Department (HOSPITAL_COMMUNITY)
Admission: EM | Admit: 2012-02-26 | Discharge: 2012-02-26 | Disposition: A | Discharge status: Home / Self Care | Payer: Medicaid Other | Attending: Emergency Medicine | Admitting: Emergency Medicine

## 2012-02-26 DIAGNOSIS — F411 Generalized anxiety disorder: Secondary | ICD-10-CM | POA: Insufficient documentation

## 2012-02-26 DIAGNOSIS — G8929 Other chronic pain: Secondary | ICD-10-CM | POA: Insufficient documentation

## 2012-02-26 DIAGNOSIS — K279 Peptic ulcer, site unspecified, unspecified as acute or chronic, without hemorrhage or perforation: Secondary | ICD-10-CM | POA: Insufficient documentation

## 2012-02-26 DIAGNOSIS — I10 Essential (primary) hypertension: Secondary | ICD-10-CM | POA: Insufficient documentation

## 2012-02-26 DIAGNOSIS — M549 Dorsalgia, unspecified: Secondary | ICD-10-CM | POA: Insufficient documentation

## 2012-02-26 DIAGNOSIS — R6884 Jaw pain: Secondary | ICD-10-CM

## 2012-02-26 DIAGNOSIS — Z79899 Other long term (current) drug therapy: Secondary | ICD-10-CM | POA: Insufficient documentation

## 2012-02-26 DIAGNOSIS — F17200 Nicotine dependence, unspecified, uncomplicated: Secondary | ICD-10-CM | POA: Insufficient documentation

## 2012-02-26 DIAGNOSIS — M25559 Pain in unspecified hip: Secondary | ICD-10-CM | POA: Insufficient documentation

## 2012-02-26 MED ORDER — OXYCODONE-ACETAMINOPHEN 5-325 MG PO TABS: 1.0000 | ORAL_TABLET | ORAL | Status: DC | PRN

## 2012-02-26 MED ORDER — OXYCODONE-ACETAMINOPHEN 5-325 MG PO TABS
2.0000 | ORAL_TABLET | Freq: Once | ORAL | Status: AC
  Administered 2012-02-26: 2 via ORAL
  Filled 2012-02-26: qty 2

## 2012-02-26 NOTE — ED Notes (Signed)
Pain rt lower mandible, Took removed 12/5, Has had pain since and rechecked by dentist.

## 2012-02-26 NOTE — ED Provider Notes (Signed)
History   This chart was scribed for Lyanne Co, MD by Sofie Rower, ED Scribe. The patient was seen in room APA15/APA15 and the patient's care was started at 10:29PM.   '  CSN: [id number]  Arrival date & time 02/26/12  2207   First MD Initiated Contact with Patient 02/26/12 2229      Chief Complaint  Patient presents with  . Dental Pain    (Consider location/radiation/quality/duration/timing/severity/associated sxs/prior treatment) The history is provided by the patient and the spouse.    Eddie Cole is a 37 y.o. male , with a hx of hypertension, who presents to the Emergency Department complaining of gradual, progressively worsening, dental pain located at the right lower jaw, onset 27 days ago (01/30/12). The pt reports he had a tooth surgically removed on the 5th of December, 2013 (Performed by Dr. Jarold Motto). The pt informs that he has been in severe pain ever since the procedure was conducted and has been unable to follow up with his dentist due to the holiday schedule. The pt has not taken any medications to relieve his dental pain at present time.  PCP is Dr. Charlann Boxer.    Past Medical History  Diagnosis Date  . HTN (hypertension)   . Anxiety   . Chronic pain     Hip/back  . PUD (peptic ulcer disease)     Past Surgical History  Procedure Date  . Hip surgery     ORIF  . Fx pelvis     Family History  Problem Relation Age of Onset  . Coronary artery disease Paternal Grandfather 27    MI    History  Substance Use Topics  . Smoking status: Smoker, Current Status Unknown -- 1.0 packs/day for 14 years    Types: Cigarettes  . Smokeless tobacco: Not on file  . Alcohol Use: No      Review of Systems  10 Systems reviewed and all are negative for acute change except as noted in the HPI.    Allergies  Morphine and related  Home Medications   Current Outpatient Rx  Name  Route  Sig  Dispense  Refill  . DIAZEPAM 10 MG PO TABS   Oral   Take 10 mg by  mouth 4 (four) times daily as needed. For anxiety         . LISINOPRIL 10 MG PO TABS   Oral   Take 1 tablet (10 mg total) by mouth daily.   30 tablet   6   . OMEPRAZOLE 20 MG PO CPDR   Oral   Take 20 mg by mouth daily.         . OXYCODONE HCL 5 MG PO CAPS   Oral   Take 5 mg by mouth 4 (four) times daily as needed. For 4 days for post dental procedural pain         . OXYCODONE-ACETAMINOPHEN 10-325 MG PO TABS   Oral   Take 1 tablet by mouth every 4 (four) hours as needed for pain.   30 tablet   0   . OXYCODONE-ACETAMINOPHEN 5-325 MG PO TABS   Oral   Take 2 tablets by mouth every 4 (four) hours as needed for pain.   6 tablet   0   . SUCRALFATE 1 G PO TABS   Oral   Take 1 g by mouth 2 (two) times daily.           BP 157/106  Pulse 96  Temp 98.5  F (36.9 C) (Oral)  Resp 18  Ht 5\' 10"  (1.778 m)  Wt 170 lb (77.111 kg)  BMI 24.39 kg/m2  SpO2 99%  Physical Exam  Nursing note and vitals reviewed. Constitutional: He is oriented to person, place, and time. He appears well-developed and well-nourished.  HENT:  Head: Normocephalic.       No facial swelling detected.  Tooth 29 is surgically extracted.  There is a defect in the gingiva at this level from where the extraction took place.  There is a small amount of food impacted into the extraction site.  No surrounding drainage or pus.  No surrounding erythema.  Tolerating secretions.    Eyes: EOM are normal.  Neck: Normal range of motion.  Pulmonary/Chest: Effort normal.  Abdominal: He exhibits no distension.  Musculoskeletal: Normal range of motion.  Neurological: He is alert and oriented to person, place, and time.  Psychiatric: He has a normal mood and affect.    ED Course  Procedures (including critical care time)  DIAGNOSTIC STUDIES: Oxygen Saturation is 99% on room air, normal by my interpretation.    COORDINATION OF CARE:   10:37 PM- Treatment plan discussed with patient. Pt agrees with treatment.       Labs Reviewed - No data to display No results found.   1. Jaw pain       MDM  Syringe irrigation of the extraction site or move the food particles.  No significant signs of infection.  Pain OB treated.  The patient will followup with his oral surgeon in the next several days.  He will call his oral surgeon tomorrow for followup.  He is asked for referral to a different oral surgeon as well.      I personally performed the services described in this documentation, which was scribed in my presence. The recorded information has been reviewed and is accurate.      Lyanne Co, MD 02/26/12 [phone]

## 2012-03-10 ENCOUNTER — Encounter (HOSPITAL_BASED_OUTPATIENT_CLINIC_OR_DEPARTMENT_OTHER): Payer: Self-pay

## 2012-03-10 ENCOUNTER — Emergency Department (HOSPITAL_BASED_OUTPATIENT_CLINIC_OR_DEPARTMENT_OTHER): Payer: Medicaid Other

## 2012-03-10 ENCOUNTER — Emergency Department (HOSPITAL_BASED_OUTPATIENT_CLINIC_OR_DEPARTMENT_OTHER)
Admission: EM | Admit: 2012-03-10 | Discharge: 2012-03-10 | Disposition: A | Discharge status: Home / Self Care | Payer: Medicaid Other | Attending: Emergency Medicine | Admitting: Emergency Medicine

## 2012-03-10 DIAGNOSIS — M549 Dorsalgia, unspecified: Secondary | ICD-10-CM | POA: Insufficient documentation

## 2012-03-10 DIAGNOSIS — X500XXA Overexertion from strenuous movement or load, initial encounter: Secondary | ICD-10-CM | POA: Insufficient documentation

## 2012-03-10 DIAGNOSIS — F17200 Nicotine dependence, unspecified, uncomplicated: Secondary | ICD-10-CM | POA: Insufficient documentation

## 2012-03-10 DIAGNOSIS — G8929 Other chronic pain: Secondary | ICD-10-CM | POA: Insufficient documentation

## 2012-03-10 DIAGNOSIS — IMO0002 Reserved for concepts with insufficient information to code with codable children: Secondary | ICD-10-CM | POA: Insufficient documentation

## 2012-03-10 DIAGNOSIS — Y9389 Activity, other specified: Secondary | ICD-10-CM | POA: Insufficient documentation

## 2012-03-10 DIAGNOSIS — F411 Generalized anxiety disorder: Secondary | ICD-10-CM | POA: Insufficient documentation

## 2012-03-10 DIAGNOSIS — Z8711 Personal history of peptic ulcer disease: Secondary | ICD-10-CM | POA: Insufficient documentation

## 2012-03-10 DIAGNOSIS — Z791 Long term (current) use of non-steroidal anti-inflammatories (NSAID): Secondary | ICD-10-CM | POA: Insufficient documentation

## 2012-03-10 DIAGNOSIS — S46919A Strain of unspecified muscle, fascia and tendon at shoulder and upper arm level, unspecified arm, initial encounter: Secondary | ICD-10-CM

## 2012-03-10 DIAGNOSIS — I10 Essential (primary) hypertension: Secondary | ICD-10-CM | POA: Insufficient documentation

## 2012-03-10 DIAGNOSIS — Y929 Unspecified place or not applicable: Secondary | ICD-10-CM | POA: Insufficient documentation

## 2012-03-10 DIAGNOSIS — Z79899 Other long term (current) drug therapy: Secondary | ICD-10-CM | POA: Insufficient documentation

## 2012-03-10 MED ORDER — IBUPROFEN 600 MG PO TABS: 600.0000 mg | ORAL_TABLET | Freq: Four times a day (QID) | ORAL | Status: DC | PRN

## 2012-03-10 MED ORDER — OXYCODONE-ACETAMINOPHEN 5-325 MG PO TABS
2.0000 | ORAL_TABLET | Freq: Once | ORAL | Status: AC
  Administered 2012-03-10: 2 via ORAL
  Filled 2012-03-10 (×2): qty 2

## 2012-03-10 MED ORDER — OXYCODONE-ACETAMINOPHEN 5-325 MG PO TABS: 2.0000 | ORAL_TABLET | ORAL | Status: DC | PRN

## 2012-03-10 NOTE — ED Provider Notes (Signed)
History     CSN: [id number]  Arrival date & time 03/10/12  1422   First MD Initiated Contact with Patient 03/10/12 1432      Chief Complaint  Patient presents with  . Elbow Injury    (Consider location/radiation/quality/duration/timing/severity/associated sxs/prior treatment) HPI Comments: Patient presents with pain to his left elbow that started yesterday. He was trying to loosen a bolt on the car and as he was twisting it and releasing that he felt a pop in the back part of his elbow. He states at that time he had a sharp pain and then went away and then during the night it started throbbing begins gotten worse throughout today. He primarily has pain on the back of his elbows it's worse when he tries to straighten his arm. He denies any numbness or weakness in his hands currently. He denies any other injuries.   Past Medical History  Diagnosis Date  . HTN (hypertension)   . Anxiety   . Chronic pain     Hip/back  . PUD (peptic ulcer disease)     Past Surgical History  Procedure Date  . Hip surgery     ORIF  . Fx pelvis   . Joint replacement     Family History  Problem Relation Age of Onset  . Coronary artery disease Paternal Grandfather 52    MI    History  Substance Use Topics  . Smoking status: Smoker, Current Status Unknown -- 1.0 packs/day for 14 years    Types: Cigarettes  . Smokeless tobacco: Not on file  . Alcohol Use: No      Review of Systems  Constitutional: Negative for fever.  HENT: Negative for neck pain.   Respiratory: Negative.   Cardiovascular: Negative.   Gastrointestinal: Negative for nausea and vomiting.  Musculoskeletal: Positive for joint swelling. Negative for back pain.  Skin: Negative for wound.  Neurological: Negative for weakness, numbness and headaches.    Allergies  Morphine and related  Home Medications   Current Outpatient Rx  Name  Route  Sig  Dispense  Refill  . ACETAMINOPHEN 500 MG PO TABS   Oral   Take 1,000 mg  by mouth every 6 (six) hours as needed. Pain         . DIAZEPAM 10 MG PO TABS   Oral   Take 10 mg by mouth 4 (four) times daily as needed. For anxiety         . IBUPROFEN 200 MG PO TABS   Oral   Take 400 mg by mouth every 6 (six) hours as needed. Pain         . IBUPROFEN 600 MG PO TABS   Oral   Take 1 tablet (600 mg total) by mouth every 6 (six) hours as needed for pain.   30 tablet   0   . LISINOPRIL 10 MG PO TABS   Oral   Take 1 tablet (10 mg total) by mouth daily.   30 tablet   6   . OMEPRAZOLE 20 MG PO CPDR   Oral   Take 20 mg by mouth daily.         . OXYCODONE-ACETAMINOPHEN 5-325 MG PO TABS   Oral   Take 1 tablet by mouth every 4 (four) hours as needed for pain.   15 tablet   0   . OXYCODONE-ACETAMINOPHEN 5-325 MG PO TABS   Oral   Take 2 tablets by mouth every 4 (four) hours as needed for  pain.   6 tablet   0   . SUCRALFATE 1 G PO TABS   Oral   Take 1 g by mouth 2 (two) times daily.         . TRAZODONE HCL 50 MG PO TABS   Oral   Take 50 mg by mouth at bedtime as needed. Sleep           BP 119/81  Pulse 78  Temp 99.2 F (37.3 C) (Oral)  Resp 20  Ht 5\' 10"  (1.778 m)  Wt 170 lb (77.111 kg)  BMI 24.39 kg/m2  SpO2 96%  Physical Exam  Constitutional: He is oriented to person, place, and time. He appears well-developed and well-nourished.  HENT:  Head: Normocephalic and atraumatic.  Neck: Normal range of motion. Neck supple.       No pain to neck or back  Musculoskeletal: He exhibits edema and tenderness.       Patient has tenderness along the posterior aspect of the elbow at the area of the olecranon where the trapezius muscle attaches. He has no other bony tenderness to the elbow. He has no pain to the shoulder or the wrist. He has normal sensation in the hand. He has normal motor function in the hand. Pulses are intact. He has worsening pain on extension of the arm at the elbow joint.  Neurological: He is alert and oriented to person,  place, and time.    ED Course  Procedures (including critical care time)  Dg Elbow Complete Left  03/10/2012  *RADIOLOGY REPORT*  Clinical Data: Pain post trauma  LEFT ELBOW - COMPLETE 3+ VIEW  Comparison: None.  Findings: Frontal, bilateral oblique, and lateral views were obtained.  There is no fracture, dislocation, or effusion.  Joint spaces appear intact.  No erosive change.  IMPRESSION: No abnormality noted radiographically.   Original Report Authenticated By: Bretta Bang, M.D.      1. Elbow strain       MDM  PT with pain along insertion of trapezius muscle in elbow.  Likely muscle tear/partial tendon rupture.  Will place in sling, analgesics, f/u with ortho.        Rolan Bucco, MD 03/10/12 [phone]

## 2012-03-10 NOTE — ED Notes (Signed)
Injury to left elbow that occurred yesterday while working on a car.

## 2012-04-06 ENCOUNTER — Emergency Department (HOSPITAL_COMMUNITY)
Admission: EM | Admit: 2012-04-06 | Discharge: 2012-04-06 | Disposition: A | Discharge status: Home / Self Care | Payer: Medicaid Other | Attending: Emergency Medicine | Admitting: Emergency Medicine

## 2012-04-06 ENCOUNTER — Encounter (HOSPITAL_COMMUNITY): Payer: Self-pay | Admitting: *Deleted

## 2012-04-06 DIAGNOSIS — Z8711 Personal history of peptic ulcer disease: Secondary | ICD-10-CM | POA: Insufficient documentation

## 2012-04-06 DIAGNOSIS — M25559 Pain in unspecified hip: Secondary | ICD-10-CM | POA: Insufficient documentation

## 2012-04-06 DIAGNOSIS — M549 Dorsalgia, unspecified: Secondary | ICD-10-CM | POA: Insufficient documentation

## 2012-04-06 DIAGNOSIS — G8929 Other chronic pain: Secondary | ICD-10-CM | POA: Insufficient documentation

## 2012-04-06 DIAGNOSIS — Z791 Long term (current) use of non-steroidal anti-inflammatories (NSAID): Secondary | ICD-10-CM | POA: Insufficient documentation

## 2012-04-06 DIAGNOSIS — F411 Generalized anxiety disorder: Secondary | ICD-10-CM | POA: Insufficient documentation

## 2012-04-06 DIAGNOSIS — F17200 Nicotine dependence, unspecified, uncomplicated: Secondary | ICD-10-CM | POA: Insufficient documentation

## 2012-04-06 DIAGNOSIS — I10 Essential (primary) hypertension: Secondary | ICD-10-CM | POA: Insufficient documentation

## 2012-04-06 DIAGNOSIS — Z79899 Other long term (current) drug therapy: Secondary | ICD-10-CM | POA: Insufficient documentation

## 2012-04-06 MED ORDER — KETOROLAC TROMETHAMINE 60 MG/2ML IM SOLN
60.0000 mg | Freq: Once | INTRAMUSCULAR | Status: AC
Start: 2012-04-06 — End: 2012-04-06
  Administered 2012-04-06: 60 mg via INTRAMUSCULAR
  Filled 2012-04-06: qty 2

## 2012-04-06 NOTE — ED Notes (Signed)
Pt c/o mid back pain radiating around to his rib cage area. Pt describes pain as a crushing pain. No known injury that he can remember.

## 2012-04-06 NOTE — ED Provider Notes (Signed)
History     CSN: [id number]  Arrival date & time 04/06/12  0603   None     Chief Complaint  Patient presents with  . Back Pain    (Consider location/radiation/quality/duration/timing/severity/associated sxs/prior treatment) HPI Comments: IVON OELKERS is a 37 y.o. Male who is here for a sudden onset of mid back pain. The pain started early this morning, when it awoke him from sleep. It is unrelenting. It causes a "crushing" sensation. The pain radiates to the bilateral flanks. There is no associated weakness, numbness, fever, dizziness, dysuria, bowel or urinary incontinence. He has had similar pain before, but "not this high." He possibly injured himself yesterday when he was pushing a lawnmower. The pain is worse with movement. He, states that he took ibuprofen, for the pain, without relief. There are no other modifying factors.  Patient is a 37 y.o. male presenting with back pain. The history is provided by the patient.  Back Pain   Past Medical History  Diagnosis Date  . HTN (hypertension)   . Anxiety   . Chronic pain     Hip/back  . PUD (peptic ulcer disease)     Past Surgical History  Procedure Laterality Date  . Hip surgery      ORIF  . Fx pelvis    . Joint replacement      Family History  Problem Relation Age of Onset  . Coronary artery disease Paternal Grandfather 72    MI    History  Substance Use Topics  . Smoking status: Smoker, Current Status Unknown -- 1.00 packs/day for 14 years    Types: Cigarettes  . Smokeless tobacco: Not on file  . Alcohol Use: No      Review of Systems  Musculoskeletal: Positive for back pain.  All other systems reviewed and are negative.    Allergies  Morphine and related  Home Medications   Current Outpatient Rx  Name  Route  Sig  Dispense  Refill  . acetaminophen (TYLENOL) 500 MG tablet   Oral   Take 1,000 mg by mouth every 6 (six) hours as needed. Pain         . diazepam (VALIUM) 10 MG tablet  Oral   Take 10 mg by mouth 4 (four) times daily as needed. For anxiety         . ibuprofen (ADVIL,MOTRIN) 200 MG tablet   Oral   Take 400 mg by mouth every 6 (six) hours as needed. Pain         . ibuprofen (ADVIL,MOTRIN) 600 MG tablet   Oral   Take 1 tablet (600 mg total) by mouth every 6 (six) hours as needed for pain.   30 tablet   0   . lisinopril (PRINIVIL,ZESTRIL) 10 MG tablet   Oral   Take 1 tablet (10 mg total) by mouth daily.   30 tablet   6   . omeprazole (PRILOSEC) 20 MG capsule   Oral   Take 20 mg by mouth daily.         . sucralfate (CARAFATE) 1 G tablet   Oral   Take 1 g by mouth 2 (two) times daily.         . traZODone (DESYREL) 50 MG tablet   Oral   Take 50 mg by mouth at bedtime as needed. Sleep           BP 136/93  Temp(Src) 97.9 F (36.6 C) (Oral)  Resp 20  Ht 5\' 10"  (1.778  m)  Wt 170 lb (77.111 kg)  BMI 24.39 kg/m2  SpO2 98%  Physical Exam  Nursing note and vitals reviewed. Constitutional: He is oriented to person, place, and time. He appears well-developed and well-nourished.  HENT:  Head: Normocephalic and atraumatic.  Right Ear: External ear normal.  Left Ear: External ear normal.  Eyes: Conjunctivae and EOM are normal. Pupils are equal, round, and reactive to light.  Neck: Normal range of motion and phonation normal. Neck supple.  Cardiovascular: Normal rate, regular rhythm, normal heart sounds and intact distal pulses.   Pulmonary/Chest: Effort normal and breath sounds normal. He exhibits no bony tenderness.  Abdominal: Soft. Normal appearance. There is no tenderness.  Musculoskeletal: Normal range of motion.  Mild low thoracic tenderness, bilaterally, including the midline. There is no spine deformity. He is able to move from supine to sitting position on the stretcher, with ease.  Neurological: He is alert and oriented to person, place, and time. He has normal strength. No cranial nerve deficit or sensory deficit. He exhibits  normal muscle tone. Coordination normal.  Skin: Skin is warm, dry and intact.  Psychiatric: He has a normal mood and affect. His behavior is normal. Judgment and thought content normal.    ED Course  Procedures (including critical care time)  He was offered an injection of Toradol for his discomfort   Review of [place]  Substance abuse data Bank; the case that he has had 17 prescriptions for narcotic medications prescribed by multiple providers in the last several months. This indicates a pattern of drug-seeking behavior.    Nursing notes, applicable records and vitals reviewed.  Radiologic Images/Reports reviewed.    1. Back pain   2. Drug-seeking behavior       MDM  Nonspecific back pain, with differential diagnosis including mild muscle strain and drug-seeking behavior. Doubt fracture, discitis, cauda equina syndrome    Plan: Home Medications- NSAID of choice; Home Treatments- ice; Recommended follow up- PCP prn      Flint Melter, MD 04/06/12 [phone]

## 2012-05-18 ENCOUNTER — Emergency Department (HOSPITAL_COMMUNITY)
Admission: EM | Admit: 2012-05-18 | Discharge: 2012-05-19 | Disposition: A | Discharge status: Home / Self Care | Payer: Medicaid Other | Attending: Emergency Medicine | Admitting: Emergency Medicine

## 2012-05-18 ENCOUNTER — Encounter (HOSPITAL_COMMUNITY): Payer: Self-pay | Admitting: Emergency Medicine

## 2012-05-18 DIAGNOSIS — G8929 Other chronic pain: Secondary | ICD-10-CM | POA: Insufficient documentation

## 2012-05-18 DIAGNOSIS — S239XXA Sprain of unspecified parts of thorax, initial encounter: Secondary | ICD-10-CM | POA: Insufficient documentation

## 2012-05-18 DIAGNOSIS — Y939 Activity, unspecified: Secondary | ICD-10-CM | POA: Insufficient documentation

## 2012-05-18 DIAGNOSIS — Z79899 Other long term (current) drug therapy: Secondary | ICD-10-CM | POA: Insufficient documentation

## 2012-05-18 DIAGNOSIS — F411 Generalized anxiety disorder: Secondary | ICD-10-CM | POA: Insufficient documentation

## 2012-05-18 DIAGNOSIS — S79919A Unspecified injury of unspecified hip, initial encounter: Secondary | ICD-10-CM | POA: Insufficient documentation

## 2012-05-18 DIAGNOSIS — F17200 Nicotine dependence, unspecified, uncomplicated: Secondary | ICD-10-CM | POA: Insufficient documentation

## 2012-05-18 DIAGNOSIS — Z8711 Personal history of peptic ulcer disease: Secondary | ICD-10-CM | POA: Insufficient documentation

## 2012-05-18 DIAGNOSIS — I10 Essential (primary) hypertension: Secondary | ICD-10-CM | POA: Insufficient documentation

## 2012-05-18 DIAGNOSIS — W010XXA Fall on same level from slipping, tripping and stumbling without subsequent striking against object, initial encounter: Secondary | ICD-10-CM | POA: Insufficient documentation

## 2012-05-18 DIAGNOSIS — M25551 Pain in right hip: Secondary | ICD-10-CM

## 2012-05-18 DIAGNOSIS — Y929 Unspecified place or not applicable: Secondary | ICD-10-CM | POA: Insufficient documentation

## 2012-05-18 DIAGNOSIS — S29019A Strain of muscle and tendon of unspecified wall of thorax, initial encounter: Secondary | ICD-10-CM
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
## 2012-05-19 ENCOUNTER — Emergency Department (HOSPITAL_COMMUNITY): Payer: Medicaid Other

## 2012-05-19 MED ORDER — METHOCARBAMOL 500 MG PO TABS: 1000.0000 mg | ORAL_TABLET | Freq: Four times a day (QID) | ORAL | Status: DC

## 2012-05-19 MED ORDER — OXYCODONE-ACETAMINOPHEN 5-325 MG PO TABS
2.0000 | ORAL_TABLET | Freq: Once | ORAL | Status: AC
  Administered 2012-05-19: 2 via ORAL
  Filled 2012-05-19: qty 2

## 2012-05-19 MED ORDER — METHOCARBAMOL 500 MG PO TABS
1000.0000 mg | ORAL_TABLET | Freq: Once | ORAL | Status: AC
  Administered 2012-05-19: 1000 mg via ORAL
  Filled 2012-05-19: qty 2

## 2012-05-19 MED ORDER — LIDOCAINE 5 % EX PTCH: 1.0000 | MEDICATED_PATCH | CUTANEOUS | Status: DC
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
## 2012-05-19 NOTE — ED Provider Notes (Signed)
Medical screening examination/treatment/procedure(s) were performed by non-physician practitioner and as supervising physician I was immediately available for consultation/collaboration.  Paula Zietz S. Azarie Coriz, MD 05/19/12 0203 

## 2012-05-22 ENCOUNTER — Encounter (HOSPITAL_COMMUNITY): Payer: Self-pay | Admitting: *Deleted

## 2012-05-22 ENCOUNTER — Emergency Department (HOSPITAL_COMMUNITY)
Admission: EM | Admit: 2012-05-22 | Discharge: 2012-05-22 | Disposition: A | Discharge status: Home / Self Care | Payer: Medicaid Other | Attending: Emergency Medicine | Admitting: Emergency Medicine

## 2012-05-22 ENCOUNTER — Emergency Department (HOSPITAL_COMMUNITY): Payer: Medicaid Other

## 2012-05-22 DIAGNOSIS — Z8781 Personal history of (healed) traumatic fracture: Secondary | ICD-10-CM | POA: Insufficient documentation

## 2012-05-22 DIAGNOSIS — Z87828 Personal history of other (healed) physical injury and trauma: Secondary | ICD-10-CM | POA: Insufficient documentation

## 2012-05-22 DIAGNOSIS — Z9660 Presence of unspecified orthopedic joint implant: Secondary | ICD-10-CM | POA: Insufficient documentation

## 2012-05-22 DIAGNOSIS — M25519 Pain in unspecified shoulder: Secondary | ICD-10-CM | POA: Insufficient documentation

## 2012-05-22 DIAGNOSIS — F411 Generalized anxiety disorder: Secondary | ICD-10-CM | POA: Insufficient documentation

## 2012-05-22 DIAGNOSIS — Z8739 Personal history of other diseases of the musculoskeletal system and connective tissue: Secondary | ICD-10-CM | POA: Insufficient documentation

## 2012-05-22 DIAGNOSIS — I10 Essential (primary) hypertension: Secondary | ICD-10-CM | POA: Insufficient documentation

## 2012-05-22 DIAGNOSIS — M542 Cervicalgia: Secondary | ICD-10-CM | POA: Insufficient documentation

## 2012-05-22 DIAGNOSIS — K279 Peptic ulcer, site unspecified, unspecified as acute or chronic, without hemorrhage or perforation: Secondary | ICD-10-CM | POA: Insufficient documentation

## 2012-05-22 DIAGNOSIS — G8929 Other chronic pain: Secondary | ICD-10-CM | POA: Insufficient documentation

## 2012-05-22 DIAGNOSIS — Z79899 Other long term (current) drug therapy: Secondary | ICD-10-CM | POA: Insufficient documentation

## 2012-05-22 DIAGNOSIS — F17200 Nicotine dependence, unspecified, uncomplicated: Secondary | ICD-10-CM | POA: Insufficient documentation

## 2012-05-22 DIAGNOSIS — M25512 Pain in left shoulder: Secondary | ICD-10-CM

## 2012-05-22 MED ORDER — HYDROCODONE-ACETAMINOPHEN 5-325 MG PO TABS
1.0000 | ORAL_TABLET | Freq: Once | ORAL | Status: AC
  Administered 2012-05-22: 1 via ORAL
  Filled 2012-05-22: qty 1
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
## 2012-05-22 NOTE — ED Notes (Signed)
Pt explained that he would not be getting any narcotics due to physician looking up the national data base of narcotics and pt was on there about getting to many meds. Pt got upset saying, "I'm not no drug addict." he did sign for his discharge papers but refused to take discharge papers stating "I don't give a fuck about those papers."

## 2012-05-23 NOTE — ED Provider Notes (Signed)
Medical screening examination/treatment/procedure(s) were performed by non-physician practitioner and as supervising physician I was immediately available for consultation/collaboration.   Meshilem Machuca III, MD 05/23/12 1124 

## 2012-06-18 ENCOUNTER — Ambulatory Visit (INDEPENDENT_AMBULATORY_CARE_PROVIDER_SITE_OTHER): Payer: Medicaid Other

## 2012-06-18 ENCOUNTER — Other Ambulatory Visit: Payer: Self-pay | Admitting: Orthopedic Surgery

## 2012-06-18 DIAGNOSIS — R52 Pain, unspecified: Secondary | ICD-10-CM

## 2012-07-19 ENCOUNTER — Emergency Department (HOSPITAL_COMMUNITY)
Admission: EM | Admit: 2012-07-19 | Discharge: 2012-07-20 | Disposition: A | Discharge status: Home / Self Care | Payer: Medicaid Other | Attending: Emergency Medicine | Admitting: Emergency Medicine

## 2012-07-19 ENCOUNTER — Emergency Department (HOSPITAL_COMMUNITY): Payer: Medicaid Other

## 2012-07-19 ENCOUNTER — Encounter (HOSPITAL_COMMUNITY): Payer: Self-pay | Admitting: Emergency Medicine

## 2012-07-19 DIAGNOSIS — F17200 Nicotine dependence, unspecified, uncomplicated: Secondary | ICD-10-CM | POA: Insufficient documentation

## 2012-07-19 DIAGNOSIS — Y9389 Activity, other specified: Secondary | ICD-10-CM | POA: Insufficient documentation

## 2012-07-19 DIAGNOSIS — Y9289 Other specified places as the place of occurrence of the external cause: Secondary | ICD-10-CM | POA: Insufficient documentation

## 2012-07-19 DIAGNOSIS — F411 Generalized anxiety disorder: Secondary | ICD-10-CM | POA: Insufficient documentation

## 2012-07-19 DIAGNOSIS — Z9889 Other specified postprocedural states: Secondary | ICD-10-CM | POA: Insufficient documentation

## 2012-07-19 DIAGNOSIS — X500XXA Overexertion from strenuous movement or load, initial encounter: Secondary | ICD-10-CM | POA: Insufficient documentation

## 2012-07-19 DIAGNOSIS — Z79899 Other long term (current) drug therapy: Secondary | ICD-10-CM | POA: Insufficient documentation

## 2012-07-19 DIAGNOSIS — I10 Essential (primary) hypertension: Secondary | ICD-10-CM | POA: Insufficient documentation

## 2012-07-19 DIAGNOSIS — Z8781 Personal history of (healed) traumatic fracture: Secondary | ICD-10-CM | POA: Insufficient documentation

## 2012-07-19 DIAGNOSIS — M25551 Pain in right hip: Secondary | ICD-10-CM

## 2012-07-19 DIAGNOSIS — S79919A Unspecified injury of unspecified hip, initial encounter: Secondary | ICD-10-CM | POA: Insufficient documentation

## 2012-07-19 DIAGNOSIS — Z8711 Personal history of peptic ulcer disease: Secondary | ICD-10-CM | POA: Insufficient documentation

## 2012-07-19 MED ORDER — OXYCODONE-ACETAMINOPHEN 5-325 MG PO TABS
2.0000 | ORAL_TABLET | Freq: Once | ORAL | Status: AC
  Administered 2012-07-19: 2 via ORAL
  Filled 2012-07-19: qty 2

## 2012-07-19 MED ORDER — NAPROXEN 500 MG PO TABS: 500.0000 mg | ORAL_TABLET | Freq: Two times a day (BID) | ORAL | Status: DC

## 2012-07-19 MED ORDER — OXYCODONE-ACETAMINOPHEN 5-325 MG PO TABS: 1.0000 | ORAL_TABLET | ORAL | Status: DC | PRN
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
## 2012-07-19 NOTE — ED Notes (Signed)
Pt reports he felt hip pop previously tonight, and since that time has had increased pain.  Pt ambulating in room using a cane.

## 2012-07-20 NOTE — ED Notes (Signed)
Pt angry he is not being discharged with Percocet prepack or additional pain medication prior to discharge.  Explained to pt that since he was given 2 Percocet within past hour, we were unable to safely administer any more narcotics.  Pt refused alternative pain medication suggestions.

## 2012-07-30 ENCOUNTER — Other Ambulatory Visit (HOSPITAL_COMMUNITY): Payer: Self-pay | Admitting: Orthopedic Surgery

## 2012-07-30 DIAGNOSIS — M25511 Pain in right shoulder: Secondary | ICD-10-CM

## 2012-07-31 ENCOUNTER — Ambulatory Visit (HOSPITAL_COMMUNITY)
Admission: RE | Admit: 2012-07-31 | Discharge: 2012-07-31 | Disposition: A | Discharge status: Home / Self Care | Payer: Medicaid Other | Source: Ambulatory Visit | Attending: Orthopedic Surgery | Admitting: Orthopedic Surgery

## 2012-07-31 DIAGNOSIS — M67919 Unspecified disorder of synovium and tendon, unspecified shoulder: Secondary | ICD-10-CM | POA: Insufficient documentation

## 2012-07-31 DIAGNOSIS — M25519 Pain in unspecified shoulder: Secondary | ICD-10-CM | POA: Insufficient documentation

## 2012-07-31 DIAGNOSIS — M25511 Pain in right shoulder: Secondary | ICD-10-CM

## 2012-07-31 DIAGNOSIS — M719 Bursopathy, unspecified: Secondary | ICD-10-CM | POA: Insufficient documentation

## 2012-08-06 ENCOUNTER — Emergency Department (HOSPITAL_COMMUNITY)
Admission: EM | Admit: 2012-08-06 | Discharge: 2012-08-06 | Disposition: A | Discharge status: Home / Self Care | Payer: Medicaid Other | Attending: Emergency Medicine | Admitting: Emergency Medicine

## 2012-08-06 ENCOUNTER — Encounter (HOSPITAL_COMMUNITY): Payer: Self-pay

## 2012-08-06 DIAGNOSIS — Y9389 Activity, other specified: Secondary | ICD-10-CM | POA: Insufficient documentation

## 2012-08-06 DIAGNOSIS — I10 Essential (primary) hypertension: Secondary | ICD-10-CM | POA: Insufficient documentation

## 2012-08-06 DIAGNOSIS — W108XXA Fall (on) (from) other stairs and steps, initial encounter: Secondary | ICD-10-CM | POA: Insufficient documentation

## 2012-08-06 DIAGNOSIS — S8990XA Unspecified injury of unspecified lower leg, initial encounter: Secondary | ICD-10-CM | POA: Insufficient documentation

## 2012-08-06 DIAGNOSIS — Z79899 Other long term (current) drug therapy: Secondary | ICD-10-CM | POA: Insufficient documentation

## 2012-08-06 DIAGNOSIS — F411 Generalized anxiety disorder: Secondary | ICD-10-CM | POA: Insufficient documentation

## 2012-08-06 DIAGNOSIS — S99929A Unspecified injury of unspecified foot, initial encounter: Secondary | ICD-10-CM | POA: Insufficient documentation

## 2012-08-06 DIAGNOSIS — F17200 Nicotine dependence, unspecified, uncomplicated: Secondary | ICD-10-CM | POA: Insufficient documentation

## 2012-08-06 DIAGNOSIS — Z8711 Personal history of peptic ulcer disease: Secondary | ICD-10-CM | POA: Insufficient documentation

## 2012-08-06 DIAGNOSIS — Y92009 Unspecified place in unspecified non-institutional (private) residence as the place of occurrence of the external cause: Secondary | ICD-10-CM | POA: Insufficient documentation

## 2012-08-06 MED ORDER — ACETAMINOPHEN 500 MG PO TABS: 1000.0000 mg | ORAL_TABLET | Freq: Once | ORAL | Status: DC

## 2012-08-06 NOTE — ED Notes (Signed)
Pt states he was going down the steps of his house and his left leg slipped between the two steps, has bruising to left lower shin.

## 2012-08-06 NOTE — ED Provider Notes (Signed)
History     CSN: [id number]  Arrival date & time 08/06/12  4098   First MD Initiated Contact with Patient 08/06/12 0111      Chief Complaint  Patient presents with  . Leg Pain    (Consider location/radiation/quality/duration/timing/severity/associated sxs/prior treatment) HPI HPI Comments: Eddie Cole is a 37 y.o. male who presents to the Emergency Department complaining of left lower leg pain after falling through a stair and hitting his shin on the step. In addition he had an ax handle that flew off his ax a month ago hitting him in the left shin which is still sore.  Past Medical History  Diagnosis Date  . HTN (hypertension)   . Anxiety   . Chronic pain     Hip/back  . PUD (peptic ulcer disease)     Past Surgical History  Procedure Laterality Date  . Hip surgery      ORIF  . Fx pelvis    . Joint replacement      Family History  Problem Relation Age of Onset  . Coronary artery disease Paternal Grandfather 28    MI    History  Substance Use Topics  . Smoking status: Current Every Day Smoker -- 1.00 packs/day for 14 years    Types: Cigarettes  . Smokeless tobacco: Not on file  . Alcohol Use: Yes     Comment: occasionally      Review of Systems  Constitutional: Negative for fever.       10 Systems reviewed and are negative for acute change except as noted in the HPI.  HENT: Negative for congestion.   Eyes: Negative for discharge and redness.  Respiratory: Negative for cough and shortness of breath.   Cardiovascular: Negative for chest pain.  Gastrointestinal: Negative for vomiting and abdominal pain.  Musculoskeletal: Negative for back pain.       Left lower leg pain  Skin: Negative for rash.  Neurological: Negative for syncope, numbness and headaches.  Psychiatric/Behavioral:       No behavior change.    Allergies  Morphine and related  Home Medications   Current Outpatient Rx  Name  Route  Sig  Dispense  Refill  . diazepam (VALIUM)  10 MG tablet   Oral   Take 10 mg by mouth 4 (four) times daily as needed. For anxiety         . lisinopril (PRINIVIL,ZESTRIL) 10 MG tablet   Oral   Take 1 tablet (10 mg total) by mouth daily.   30 tablet   6   . meloxicam (MOBIC) 7.5 MG tablet   Oral   Take 7.5 mg by mouth daily as needed for pain.         . naproxen (NAPROSYN) 500 MG tablet   Oral   Take 1 tablet (500 mg total) by mouth 2 (two) times daily with a meal.   30 tablet   0   . omeprazole (PRILOSEC) 20 MG capsule   Oral   Take 20 mg by mouth daily.         . sucralfate (CARAFATE) 1 G tablet   Oral   Take 1 g by mouth 2 (two) times daily.         Marland Kitchen lidocaine (LIDODERM) 5 %   Transdermal   Place 1 patch onto the skin daily. Remove & Discard patch within 12 hours or as directed by MD   30 patch   0   . oxyCODONE-acetaminophen (PERCOCET) 5-325 MG per  tablet   Oral   Take 1 tablet by mouth every 4 (four) hours as needed for pain.   10 tablet   0   . traZODone (DESYREL) 50 MG tablet   Oral   Take 50 mg by mouth at bedtime.           BP 140/96  Pulse 92  Temp(Src) 97.5 F (36.4 C) (Oral)  Resp 20  Ht 5\' 10"  (1.778 m)  Wt 170 lb (77.111 kg)  BMI 24.39 kg/m2  SpO2 96%  Physical Exam  Nursing note and vitals reviewed. Constitutional: He appears well-developed and well-nourished.  Awake, alert, nontoxic appearance.  HENT:  Head: Normocephalic and atraumatic.  Eyes: EOM are normal. Pupils are equal, round, and reactive to light.  Neck: Normal range of motion. Neck supple.  Cardiovascular: Normal rate and intact distal pulses.   Pulmonary/Chest: Effort normal and breath sounds normal. He exhibits no tenderness.  Abdominal: Soft. Bowel sounds are normal. There is no tenderness. There is no rebound.  Musculoskeletal: He exhibits no tenderness.  Baseline ROM, no obvious new focal weakness.Left lower leg with two old bruises, one near the knee and the other low on the ankle. No pain with  palpation of the skin bone.   Neurological:  Mental status and motor strength appears baseline for patient and situation.  Skin: No rash noted.  Psychiatric: He has a normal mood and affect.    ED Course  Procedures (including critical care time)    1. Lower leg pain, left       MDM  Patient with left lower leg pain after falling through a step at home. No bruising present, no swelling present. Given tylenol. Pt stable in ED with no significant deterioration in condition.The patient appears reasonably screened and/or stabilized for discharge and I doubt any other medical condition or other [facility] requiring further screening, evaluation, or treatment in the ED at this time prior to discharge.  MDM Reviewed: nursing note and vitals           Nicoletta Dress. Colon Branch, MD 08/06/12 1610

## 2012-12-31 ENCOUNTER — Other Ambulatory Visit: Payer: Self-pay

## 2013-10-05 ENCOUNTER — Encounter: Payer: Self-pay | Admitting: Diagnostic Neuroimaging

## 2013-10-05 ENCOUNTER — Ambulatory Visit (INDEPENDENT_AMBULATORY_CARE_PROVIDER_SITE_OTHER): Payer: Medicaid Other | Admitting: Diagnostic Neuroimaging

## 2013-10-05 VITALS — BP 154/113 | HR 88 | Ht 69.0 in | Wt 195.4 lb

## 2013-10-05 DIAGNOSIS — M79604 Pain in right leg: Secondary | ICD-10-CM

## 2013-10-05 DIAGNOSIS — M25559 Pain in unspecified hip: Secondary | ICD-10-CM

## 2013-10-05 DIAGNOSIS — M79609 Pain in unspecified limb: Secondary | ICD-10-CM

## 2013-10-05 DIAGNOSIS — M25551 Pain in right hip: Secondary | ICD-10-CM
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
## 2013-10-05 NOTE — Patient Instructions (Signed)
I will order MRI lumbar spine and refer you to pain clinic (Dr. Ethelene Halamos).

## 2013-10-08 ENCOUNTER — Telehealth: Payer: Self-pay | Admitting: Diagnostic Neuroimaging

## 2013-10-08 NOTE — Telephone Encounter (Signed)
Patient calling to state that the doctor he was referred to [facility] states that he is not taking medicaid or self pay patients. Please call patient and advise.

## 2013-10-08 NOTE — Telephone Encounter (Signed)
Spoke to patient. Advised received a fax from [facility] as well. Advised working on sending referral to a different pain clinic. Patient agreed.

## 2013-10-13 ENCOUNTER — Telehealth: Payer: Self-pay | Admitting: Diagnostic Neuroimaging

## 2013-10-13 NOTE — Telephone Encounter (Signed)
Patient stated referring MD instructed him to call our office for Rx refill for HYDROCODONE 5-325 mg.  Please call and advise

## 2013-10-13 NOTE — Telephone Encounter (Signed)
Patient checking status of referral sent to another pain clinic.  Please return call and advise anytime, can leave message on voice mail if not available.

## 2013-10-13 NOTE — Telephone Encounter (Signed)
Called patient. Informed [facility]. Provided their info to check status.

## 2013-10-13 NOTE — Telephone Encounter (Signed)
Dr Marjory LiesPenumalli does not prescribe narcotic medication.  I called the patient back.   Got no answer.  Left message.

## 2013-10-13 NOTE — Telephone Encounter (Signed)
[facility], states that he called [facility] and they said that they don't even offer pain management. Please return call to patient and advise.

## 2013-10-14 ENCOUNTER — Telehealth: Payer: Self-pay | Admitting: *Deleted

## 2013-10-14 NOTE — Telephone Encounter (Signed)
-  Colin BroachLateshia with [facility] @ [phone], wanted to make Casandra aware that they don't over long term pain management.  Please feel to call with any questions.

## 2013-10-14 NOTE — Telephone Encounter (Signed)
Called patient. Informed resent (via EPIC and fax)  referral to [facility]. Patient agreed.

## 2013-10-14 NOTE — Telephone Encounter (Signed)
Patient requesting Pain Management referral sent to Dr. Laurian Brim'Toole office due to closer to home.  Dr. Jon Gills'Toole's phone # [phone].  Please call home # [phone] 1st, if not available call cell # [phone].  Please call and advise.

## 2013-10-14 NOTE — Telephone Encounter (Signed)
Spoke to patient. Informed sent referral to Dr. Laurian Brim'Toole as requested. Went over MRI denial.

## 2013-10-15 NOTE — Telephone Encounter (Signed)
Debbe OdeaLatisha calling again to speak with Tad Mooreasandra regarding patient's referral, please call her back at [phone].

## 2013-10-15 NOTE — Telephone Encounter (Signed)
Spoke to receptionist. She wanted to advise [facility] does not provide long term pain management services.

## 2013-10-18 ENCOUNTER — Other Ambulatory Visit: Payer: Medicaid Other

## 2013-10-18 ENCOUNTER — Telehealth: Payer: Self-pay | Admitting: *Deleted

## 2013-10-18 NOTE — Telephone Encounter (Signed)
Called patient to notify resent referral to fax number he provided. Called both numbers listed for recall. No answer.

## 2013-10-25 NOTE — Telephone Encounter (Signed)
Patient calling to state that Medicaid denied his MRI twice and [facility] says they cannot do anything unless he gets that MRI, please return call to patient and advise at [phone].

## 2013-10-25 NOTE — Telephone Encounter (Addendum)
Spoke to patient about previous note. Advised patient he would need to follow up with medicaid, orthopedist, or PCP. Unable able to "push MRI authorization through" as requested by patient. Patient agreed.

## 2013-11-17 ENCOUNTER — Emergency Department (HOSPITAL_COMMUNITY)
Admission: EM | Admit: 2013-11-17 | Discharge: 2013-11-18 | Disposition: A | Discharge status: Home / Self Care | Payer: Medicaid Other | Attending: Emergency Medicine | Admitting: Emergency Medicine

## 2013-11-17 ENCOUNTER — Encounter (HOSPITAL_COMMUNITY): Payer: Self-pay | Admitting: Emergency Medicine

## 2013-11-17 DIAGNOSIS — Y929 Unspecified place or not applicable: Secondary | ICD-10-CM | POA: Diagnosis not present

## 2013-11-17 DIAGNOSIS — Z79899 Other long term (current) drug therapy: Secondary | ICD-10-CM | POA: Diagnosis not present

## 2013-11-17 DIAGNOSIS — F17200 Nicotine dependence, unspecified, uncomplicated: Secondary | ICD-10-CM | POA: Insufficient documentation

## 2013-11-17 DIAGNOSIS — M549 Dorsalgia, unspecified: Secondary | ICD-10-CM | POA: Insufficient documentation

## 2013-11-17 DIAGNOSIS — I10 Essential (primary) hypertension: Secondary | ICD-10-CM | POA: Insufficient documentation

## 2013-11-17 DIAGNOSIS — IMO0002 Reserved for concepts with insufficient information to code with codable children: Secondary | ICD-10-CM | POA: Insufficient documentation

## 2013-11-17 DIAGNOSIS — Z96649 Presence of unspecified artificial hip joint: Secondary | ICD-10-CM | POA: Insufficient documentation

## 2013-11-17 DIAGNOSIS — F411 Generalized anxiety disorder: Secondary | ICD-10-CM | POA: Insufficient documentation

## 2013-11-17 DIAGNOSIS — Z8719 Personal history of other diseases of the digestive system: Secondary | ICD-10-CM | POA: Insufficient documentation

## 2013-11-17 DIAGNOSIS — S79919A Unspecified injury of unspecified hip, initial encounter: Secondary | ICD-10-CM | POA: Diagnosis not present

## 2013-11-17 DIAGNOSIS — G8929 Other chronic pain: Secondary | ICD-10-CM | POA: Insufficient documentation

## 2013-11-17 DIAGNOSIS — R296 Repeated falls: Secondary | ICD-10-CM | POA: Diagnosis not present

## 2013-11-17 DIAGNOSIS — Y9389 Activity, other specified: Secondary | ICD-10-CM | POA: Insufficient documentation

## 2013-11-17 DIAGNOSIS — S79929A Unspecified injury of unspecified thigh, initial encounter: Principal | ICD-10-CM

## 2013-11-17 DIAGNOSIS — M25551 Pain in right hip: Secondary | ICD-10-CM

## 2013-11-17 NOTE — ED Notes (Signed)
Pt c/o chronic right hip pain. No deformity or rotation noted.

## 2013-11-17 NOTE — ED Notes (Signed)
Per EMS: pt has hx of right hip replacement, began having "additional hip problems" roughly 6-8 months ago when pt began being under pain clinic management. Reports today he was standing in the kitchen when "his hip gave out and he went down to his knee", pt told EMS he has been out of his pain medication for the last month.

## 2013-11-18 ENCOUNTER — Emergency Department (HOSPITAL_COMMUNITY): Payer: Medicaid Other

## 2013-11-18 MED ORDER — DEXAMETHASONE 6 MG PO TABS: ORAL_TABLET | ORAL | Status: DC

## 2013-11-18 MED ORDER — ACETAMINOPHEN-CODEINE #3 300-30 MG PO TABS
2.0000 | ORAL_TABLET | Freq: Once | ORAL | Status: AC
  Administered 2013-11-18: 2 via ORAL
  Filled 2013-11-18: qty 2

## 2013-11-18 MED ORDER — DICLOFENAC SODIUM 75 MG PO TBEC: 75.0000 mg | DELAYED_RELEASE_TABLET | Freq: Two times a day (BID) | ORAL | Status: DC

## 2013-11-18 MED ORDER — PREDNISONE 50 MG PO TABS
60.0000 mg | ORAL_TABLET | Freq: Once | ORAL | Status: AC
  Administered 2013-11-18: 60 mg via ORAL
  Filled 2013-11-18 (×2): qty 1

## 2013-11-18 MED ORDER — KETOROLAC TROMETHAMINE 10 MG PO TABS
10.0000 mg | ORAL_TABLET | Freq: Once | ORAL | Status: AC
  Administered 2013-11-18: 10 mg via ORAL
  Filled 2013-11-18: qty 1

## 2013-11-18 MED ORDER — ACETAMINOPHEN-CODEINE #3 300-30 MG PO TABS: 1.0000 | ORAL_TABLET | Freq: Four times a day (QID) | ORAL | Status: AC | PRN

## 2013-11-18 MED ORDER — ONDANSETRON HCL 4 MG PO TABS
4.0000 mg | ORAL_TABLET | Freq: Once | ORAL | Status: AC
  Administered 2013-11-18: 4 mg via ORAL
  Filled 2013-11-18: qty 1

## 2013-11-18 NOTE — ED Provider Notes (Signed)
CSN: [id number]     Arrival date & time 11/17/13  2342 History   First MD Initiated Contact with Patient 11/17/13 2356     Chief Complaint  Patient presents with  . Hip Pain     (Consider location/radiation/quality/duration/timing/severity/associated sxs/prior Treatment) HPI Comments: Patient is a 38 year old male who presents to the emergency department by EMS tonight after having a near fall and pain in his right hip. The patient states that he was standing up in the kitchen when his right hip" gave out". The patient had major surgery involving his right hip and pelvis with multiple plates and hip replacement. The patient states that he has not had any new injury. He states that tonight he felt as though his hip gave away with him and that he has been feeling weak in the hip for several months. He states that he has been seen by his orthopedic specialist feels that they have done all that they can do for him. He's been sent to the neurology specialist who recommended pain management. The patient has not made it to the pain management specialist as of yet. The patient states he struck his left knee when he had a near fall. No other injury reported. It is of note that the EMS personnel state that the patient told them that he had been out of his pain medication for the past month.  Patient is a 38 y.o. male presenting with hip pain. The history is provided by the patient.  Hip Pain This is a chronic problem. Associated symptoms include arthralgias. Pertinent negatives include no abdominal pain, chest pain, coughing or neck pain.    Past Medical History  Diagnosis Date  . HTN (hypertension)   . Anxiety   . Chronic pain     Hip/back  . PUD (peptic ulcer disease)   . Nervousness   . Balance problem    Past Surgical History  Procedure Laterality Date  . Hip surgery      ORIF  . Fx pelvis    . Joint replacement     Family History  Problem Relation Age of Onset  . Coronary artery  disease Paternal Grandfather 55    MI  . Neuropathy Mother   . Ulcers Mother   . Other Sister     stomach problems  . Emphysema Maternal Grandmother   . Congestive Heart Failure Maternal Grandfather   . Diabetes Paternal Grandmother    History  Substance Use Topics  . Smoking status: Current Every Day Smoker -- 0.25 packs/day for 14 years    Types: Cigarettes  . Smokeless tobacco: Never Used  . Alcohol Use: Yes     Comment: occasionally    Review of Systems  Constitutional: Negative for activity change.       All ROS Neg except as noted in HPI  Eyes: Negative for photophobia and discharge.  Respiratory: Negative for cough, shortness of breath and wheezing.   Cardiovascular: Negative for chest pain and palpitations.  Gastrointestinal: Negative for abdominal pain and blood in stool.  Genitourinary: Negative for dysuria, frequency and hematuria.  Musculoskeletal: Positive for arthralgias and back pain. Negative for neck pain.  Skin: Negative.   Neurological: Negative for dizziness, seizures and speech difficulty.  Psychiatric/Behavioral: Negative for hallucinations and confusion. The patient is nervous/anxious.       Allergies  Morphine and related  Home Medications   Prior to Admission medications   Medication Sig Start Date End Date Taking? Authorizing Provider  diazepam (VALIUM)  10 MG tablet Take 10 mg by mouth 4 (four) times daily as needed. For anxiety   Yes Historical Provider, MD  gabapentin (NEURONTIN) 100 MG capsule Take 100 mg by mouth 3 (three) times daily. 09/29/13 09/29/14 Yes Historical Provider, MD  omeprazole (PRILOSEC) 20 MG capsule Take 20 mg by mouth daily.   Yes Historical Provider, MD  quinapril (ACCUPRIL) 20 MG tablet Take 1 tablet by mouth daily. 09/27/13  Yes Historical Provider, MD  sucralfate (CARAFATE) 1 G tablet Take 1 g by mouth 2 (two) times daily.   Yes Historical Provider, MD  methylPREDNISolone (MEDROL DOSEPAK) 4 MG tablet Take 1 tablet by mouth  daily. 09/29/13   Historical Provider, MD  oxyCODONE-acetaminophen (PERCOCET) 5-325 MG per tablet Take 1 tablet by mouth every 4 (four) hours as needed for pain. 07/19/12   Vida Roller, MD   BP 131/89  Pulse 88  Temp(Src) 97.7 F (36.5 C) (Oral)  Resp 18  Ht  (1.753 m)  Wt 190 lb (86.183 kg)  BMI 28.05 kg/m2  SpO2 95% Physical Exam  Nursing note and vitals reviewed. Constitutional: He is oriented to person, place, and time. He appears well-developed and well-nourished.  Non-toxic appearance.  HENT:  Head: Normocephalic.  Right Ear: Tympanic membrane and external ear normal.  Left Ear: Tympanic membrane and external ear normal.  Eyes: EOM and lids are normal. Pupils are equal, round, and reactive to light.  Neck: Normal range of motion. Neck supple. Carotid bruit is not present.  Cardiovascular: Normal rate, regular rhythm, normal heart sounds, intact distal pulses and normal pulses.   Pulmonary/Chest: Breath sounds normal. No respiratory distress.  Abdominal: Soft. Bowel sounds are normal. There is no tenderness. There is no guarding.  Musculoskeletal: Normal range of motion.  There is no shortening involving the right lower extremity. There is good range of motion of the right and left knee. There is decreased range of motion of the right hip. There is soreness to palpation of the right hip. There no hot joints appreciated. There is no red streaks from the hip. The dorsalis pedis pulses 2+ bilaterally.  Lymphadenopathy:       Head (right side): No submandibular adenopathy present.       Head (left side): No submandibular adenopathy present.    He has no cervical adenopathy.  Neurological: He is alert and oriented to person, place, and time. He has normal strength. No cranial nerve deficit or sensory deficit.  Skin: Skin is warm and dry.  Psychiatric: He has a normal mood and affect. His speech is normal.    ED Course  Procedures (including critical care time) Labs  Review Labs Reviewed - No data to display  Imaging Review No results found.   EKG Interpretation None      MDM   Vital signs are well within normal limits. No neurovascular changes noted on the examination tonight. X-ray of the right hip and pelvis show no. Prosthetic fracture or dislocation. The x-ray was read as a stable postoperative appearance of the right hip and pelvis.  These findings were discussed with the patient in terms which he understands. The patient is very concerned that he is not able to see the pain management specialist as of yet and is having pain. Prescription for Decadron and Tylenol codeine given to the patient. Patient strongly advised to see his pain management specialist as scheduled.    Final diagnoses:  None    *I have reviewed nursing notes, vital signs,  and all appropriate lab and imaging results for this patient.Kathie Dike, PA-C 11/18/13 [id number]

## 2013-11-18 NOTE — Discharge Instructions (Signed)
Your x-ray is negative for fracture or dislocation. Your hardware appears to be intact. Please see your pain management specialist as suggested by your primary physician. Please use your crutches getting around to prevent further falls and near falls.  Please use diclofenac and Decadron daily until all taken. Use Tylenol codeine for pain if needed. This medication may cause drowsiness, please use with caution.

## 2013-11-18 NOTE — ED Provider Notes (Signed)
Medical screening examination/treatment/procedure(s) were performed by non-physician practitioner and as supervising physician I was immediately available for consultation/collaboration.   EKG Interpretation None       Dior Stepter R. Kyrstan Gotwalt, MD 11/18/13 0712 

## 2013-12-13 ENCOUNTER — Emergency Department (HOSPITAL_COMMUNITY)
Admission: EM | Admit: 2013-12-13 | Discharge: 2013-12-14 | Disposition: A | Discharge status: Home / Self Care | Payer: Medicaid Other | Attending: Emergency Medicine | Admitting: Emergency Medicine

## 2013-12-13 ENCOUNTER — Encounter (HOSPITAL_COMMUNITY): Payer: Self-pay | Admitting: Emergency Medicine

## 2013-12-13 DIAGNOSIS — Y9389 Activity, other specified: Secondary | ICD-10-CM | POA: Diagnosis not present

## 2013-12-13 DIAGNOSIS — Z720 Tobacco use: Secondary | ICD-10-CM | POA: Diagnosis not present

## 2013-12-13 DIAGNOSIS — K279 Peptic ulcer, site unspecified, unspecified as acute or chronic, without hemorrhage or perforation: Secondary | ICD-10-CM | POA: Insufficient documentation

## 2013-12-13 DIAGNOSIS — Z79899 Other long term (current) drug therapy: Secondary | ICD-10-CM | POA: Diagnosis not present

## 2013-12-13 DIAGNOSIS — F419 Anxiety disorder, unspecified: Secondary | ICD-10-CM | POA: Insufficient documentation

## 2013-12-13 DIAGNOSIS — Y9289 Other specified places as the place of occurrence of the external cause: Secondary | ICD-10-CM | POA: Diagnosis not present

## 2013-12-13 DIAGNOSIS — I10 Essential (primary) hypertension: Secondary | ICD-10-CM | POA: Insufficient documentation

## 2013-12-13 DIAGNOSIS — W010XXA Fall on same level from slipping, tripping and stumbling without subsequent striking against object, initial encounter: Secondary | ICD-10-CM

## 2013-12-13 DIAGNOSIS — S300XXA Contusion of lower back and pelvis, initial encounter: Secondary | ICD-10-CM | POA: Diagnosis not present

## 2013-12-13 DIAGNOSIS — S199XXA Unspecified injury of neck, initial encounter: Secondary | ICD-10-CM | POA: Diagnosis not present

## 2013-12-13 DIAGNOSIS — W01198A Fall on same level from slipping, tripping and stumbling with subsequent striking against other object, initial encounter: Secondary | ICD-10-CM | POA: Diagnosis not present

## 2013-12-13 DIAGNOSIS — S3992XA Unspecified injury of lower back, initial encounter: Secondary | ICD-10-CM | POA: Diagnosis present

## 2013-12-13 DIAGNOSIS — G8929 Other chronic pain: Secondary | ICD-10-CM | POA: Insufficient documentation

## 2013-12-13 NOTE — ED Notes (Signed)
Per EMS: pt fell backwards off of his porch 3 feet. C/o pain to right hip and lower back. Pt on spine board and C-collar upon arrival. ETOH on board.
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
## 2013-12-14 ENCOUNTER — Emergency Department (HOSPITAL_COMMUNITY): Payer: Medicaid Other

## 2013-12-14 MED ORDER — TRAMADOL HCL 50 MG PO TABS: 50.0000 mg | ORAL_TABLET | Freq: Four times a day (QID) | ORAL | Status: DC | PRN

## 2013-12-14 MED ORDER — TRAMADOL HCL 50 MG PO TABS
50.0000 mg | ORAL_TABLET | Freq: Once | ORAL | Status: AC
  Administered 2013-12-14: 50 mg via ORAL
  Filled 2013-12-14: qty 1

## 2013-12-14 NOTE — Discharge Instructions (Signed)
Contusion A contusion is a deep bruise. Contusions are the result of an injury that caused bleeding under the skin. The contusion may turn blue, purple, or yellow. Minor injuries will give you a painless contusion, but more severe contusions may stay painful and swollen for a few weeks.  CAUSES  A contusion is usually caused by a blow, trauma, or direct force to an area of the body. SYMPTOMS   Swelling and redness of the injured area.  Bruising of the injured area.  Tenderness and soreness of the injured area.  Pain. DIAGNOSIS  The diagnosis can be made by taking a history and physical exam. An X-ray, CT scan, or MRI may be needed to determine if there were any associated injuries, such as fractures. TREATMENT  Specific treatment will depend on what area of the body was injured. In general, the best treatment for a contusion is resting, icing, elevating, and applying cold compresses to the injured area. Over-the-counter medicines may also be recommended for pain control. Ask your caregiver what the best treatment is for your contusion. HOME CARE INSTRUCTIONS   Put ice on the injured area.  Put ice in a plastic bag.  Place a towel between your skin and the bag.  Leave the ice on for 15-20 minutes, 3-4 times a day, or as directed by your health care provider.  Only take over-the-counter or prescription medicines for pain, discomfort, or fever as directed by your caregiver. Your caregiver may recommend avoiding anti-inflammatory medicines (aspirin, ibuprofen, and naproxen) for 48 hours because these medicines may increase bruising.  Rest the injured area.  If possible, elevate the injured area to reduce swelling. SEEK IMMEDIATE MEDICAL CARE IF:   You have increased bruising or swelling.  You have pain that is getting worse.  Your swelling or pain is not relieved with medicines. MAKE SURE YOU:   Understand these instructions.  Will watch your condition.  Will get help right  away if you are not doing well or get worse. Document Released: 11/21/2004 Document Revised: 02/16/2013 Document Reviewed: 12/17/2010 [facility] Patient Information 2015 [place]. This information is not intended to replace advice given to you by your health care provider. Make sure you discuss any questions you have with your health care provider.  Tramadol tablets What is this medicine? TRAMADOL (TRA ma dole) is a pain reliever. It is used to treat moderate to severe pain in adults. This medicine may be used for other purposes; ask your health care provider or pharmacist if you have questions. COMMON BRAND NAME(S): Ultram What should I tell my health care provider before I take this medicine? They need to know if you have any of these conditions: -brain tumor -depression -drug abuse or addiction -head injury -if you frequently drink alcohol containing drinks -kidney disease or trouble passing urine -liver disease -lung disease, asthma, or breathing problems -seizures or epilepsy -suicidal thoughts, plans, or attempt; a previous suicide attempt by you or a family member -an unusual or allergic reaction to tramadol, codeine, other medicines, foods, dyes, or preservatives -pregnant or trying to get pregnant -breast-feeding How should I use this medicine? Take this medicine by mouth with a full glass of water. Follow the directions on the prescription label. If the medicine upsets your stomach, take it with food or milk. Do not take more medicine than you are told to take. Talk to your pediatrician regarding the use of this medicine in children. Special care may be needed. Overdosage: If you think you have  taken too much of this medicine contact a poison control center or emergency room at once. NOTE: This medicine is only for you. Do not share this medicine with others. What if I miss a dose? If you miss a dose, take it as soon as you can. If it is almost time for your next dose,  take only that dose. Do not take double or extra doses. What may interact with this medicine? Do not take this medicine with any of the following medications: -MAOIs like Carbex, Eldepryl, Marplan, Nardil, and Parnate This medicine may also interact with the following medications: -alcohol or medicines that contain alcohol -antihistamines -benzodiazepines -bupropion -carbamazepine or oxcarbazepine -clozapine -cyclobenzaprine -digoxin -furazolidone -linezolid -medicines for depression, anxiety, or psychotic disturbances -medicines for migraine headache like almotriptan, eletriptan, frovatriptan, naratriptan, rizatriptan, sumatriptan, zolmitriptan -medicines for pain like pentazocine, buprenorphine, butorphanol, meperidine, nalbuphine, and propoxyphene -medicines for sleep -muscle relaxants -naltrexone -phenobarbital -phenothiazines like perphenazine, thioridazine, chlorpromazine, mesoridazine, fluphenazine, prochlorperazine, promazine, and trifluoperazine -procarbazine -warfarin This list may not describe all possible interactions. Give your health care provider a list of all the medicines, herbs, non-prescription drugs, or dietary supplements you use. Also tell them if you smoke, drink alcohol, or use illegal drugs. Some items may interact with your medicine. What should I watch for while using this medicine? Tell your doctor or health care professional if your pain does not go away, if it gets worse, or if you have new or a different type of pain. You may develop tolerance to the medicine. Tolerance means that you will need a higher dose of the medicine for pain relief. Tolerance is normal and is expected if you take this medicine for a long time. Do not suddenly stop taking your medicine because you may develop a severe reaction. Your body becomes used to the medicine. This does NOT mean you are addicted. Addiction is a behavior related to getting and using a drug for a non-medical  reason. If you have pain, you have a medical reason to take pain medicine. Your doctor will tell you how much medicine to take. If your doctor wants you to stop the medicine, the dose will be slowly lowered over time to avoid any side effects. You may get drowsy or dizzy. Do not drive, use machinery, or do anything that needs mental alertness until you know how this medicine affects you. Do not stand or sit up quickly, especially if you are an older patient. This reduces the risk of dizzy or fainting spells. Alcohol can increase or decrease the effects of this medicine. Avoid alcoholic drinks. You may have constipation. Try to have a bowel movement at least every 2 to 3 days. If you do not have a bowel movement for 3 days, call your doctor or health care professional. Your mouth may get dry. Chewing sugarless gum or sucking hard candy, and drinking plenty of water may help. Contact your doctor if the problem does not go away or is severe. What side effects may I notice from receiving this medicine? Side effects that you should report to your doctor or health care professional as soon as possible: -allergic reactions like skin rash, itching or hives, swelling of the face, lips, or tongue -breathing difficulties, wheezing -confusion -itching -light headedness or fainting spells -redness, blistering, peeling or loosening of the skin, including inside the mouth -seizures Side effects that usually do not require medical attention (report to your doctor or health care professional if they continue or are bothersome): -constipation -dizziness -drowsiness -  headache -nausea, vomiting This list may not describe all possible side effects. Call your doctor for medical advice about side effects. You may report side effects to FDA at 1-800-FDA-1088. Where should I keep my medicine? Keep out of the reach of children. Store at room temperature between 15 and 30 degrees C (59 and 86 degrees F). Keep container  tightly closed. Throw away any unused medicine after the expiration date. NOTE: This sheet is a summary. It may not cover all possible information. If you have questions about this medicine, talk to your doctor, pharmacist, or health care provider.  2015, [employer] Standard. (2009-10-25 11:55:44)

## 2013-12-14 NOTE — ED Notes (Signed)
Pt back from CT

## 2013-12-14 NOTE — ED Notes (Signed)
Pt gone to CT 

## 2014-01-18 ENCOUNTER — Encounter (HOSPITAL_COMMUNITY): Payer: Self-pay | Admitting: Emergency Medicine

## 2014-01-18 ENCOUNTER — Emergency Department (HOSPITAL_COMMUNITY): Payer: Medicaid Other

## 2014-01-18 ENCOUNTER — Emergency Department (HOSPITAL_COMMUNITY)
Admission: EM | Admit: 2014-01-18 | Discharge: 2014-01-18 | Disposition: A | Discharge status: Home / Self Care | Payer: Medicaid Other | Attending: Emergency Medicine | Admitting: Emergency Medicine

## 2014-01-18 DIAGNOSIS — S79912A Unspecified injury of left hip, initial encounter: Secondary | ICD-10-CM | POA: Diagnosis present

## 2014-01-18 DIAGNOSIS — W19XXXA Unspecified fall, initial encounter: Secondary | ICD-10-CM

## 2014-01-18 DIAGNOSIS — W1839XA Other fall on same level, initial encounter: Secondary | ICD-10-CM | POA: Diagnosis not present

## 2014-01-18 DIAGNOSIS — Y998 Other external cause status: Secondary | ICD-10-CM | POA: Diagnosis not present

## 2014-01-18 DIAGNOSIS — G8929 Other chronic pain: Secondary | ICD-10-CM | POA: Insufficient documentation

## 2014-01-18 DIAGNOSIS — I10 Essential (primary) hypertension: Secondary | ICD-10-CM | POA: Insufficient documentation

## 2014-01-18 DIAGNOSIS — Z9889 Other specified postprocedural states: Secondary | ICD-10-CM | POA: Diagnosis not present

## 2014-01-18 DIAGNOSIS — Z79899 Other long term (current) drug therapy: Secondary | ICD-10-CM | POA: Diagnosis not present

## 2014-01-18 DIAGNOSIS — Y9389 Activity, other specified: Secondary | ICD-10-CM | POA: Diagnosis not present

## 2014-01-18 DIAGNOSIS — M25552 Pain in left hip: Secondary | ICD-10-CM

## 2014-01-18 DIAGNOSIS — Z720 Tobacco use: Secondary | ICD-10-CM | POA: Diagnosis not present

## 2014-01-18 DIAGNOSIS — F419 Anxiety disorder, unspecified: Secondary | ICD-10-CM | POA: Insufficient documentation

## 2014-01-18 DIAGNOSIS — Z8711 Personal history of peptic ulcer disease: Secondary | ICD-10-CM | POA: Insufficient documentation

## 2014-01-18 DIAGNOSIS — Y929 Unspecified place or not applicable: Secondary | ICD-10-CM | POA: Diagnosis not present

## 2014-01-18 MED ORDER — OXYCODONE-ACETAMINOPHEN 5-325 MG PO TABS: 1.0000 | ORAL_TABLET | ORAL | Status: DC | PRN

## 2014-01-18 MED ORDER — OXYCODONE-ACETAMINOPHEN 5-325 MG PO TABS
2.0000 | ORAL_TABLET | Freq: Once | ORAL | Status: AC
  Administered 2014-01-18: 2 via ORAL
  Filled 2014-01-18: qty 2

## 2014-01-18 MED ORDER — KETOROLAC TROMETHAMINE 30 MG/ML IJ SOLN
30.0000 mg | Freq: Once | INTRAMUSCULAR | Status: AC
  Administered 2014-01-18: 30 mg via INTRAVENOUS
  Filled 2014-01-18: qty 1

## 2014-01-18 MED ORDER — HYDROMORPHONE HCL 1 MG/ML IJ SOLN
1.0000 mg | Freq: Once | INTRAMUSCULAR | Status: AC
  Administered 2014-01-18: 1 mg via INTRAVENOUS
  Filled 2014-01-18: qty 1

## 2014-01-18 NOTE — ED Notes (Signed)
Pt got self up from stretcher by sitting bolt upright from lying position. Cautioned pt to log roll. Got up and dressed self w/o difficulty
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
## 2014-01-18 NOTE — Discharge Instructions (Signed)
Fall Prevention and Home Safety Falls cause injuries and can affect all age groups. It is possible to use preventive measures to significantly decrease the likelihood of falls. There are many simple measures which can make your home safer and prevent falls. OUTDOORS  Repair cracks and edges of walkways and driveways.  Remove high doorway thresholds.  Trim shrubbery on the main path into your home.  Have good outside lighting.  Clear walkways of tools, rocks, debris, and clutter.  Check that handrails are not broken and are securely fastened. Both sides of steps should have handrails.  Have leaves, snow, and ice cleared regularly.  Use sand or salt on walkways during winter months.  In the garage, clean up grease or oil spills. BATHROOM  Install night lights.  Install grab bars by the toilet and in the tub and shower.  Use non-skid mats or decals in the tub or shower.  Place a plastic non-slip stool in the shower to sit on, if needed.  Keep floors dry and clean up all water on the floor immediately.  Remove soap buildup in the tub or shower on a regular basis.  Secure bath mats with non-slip, double-sided rug tape.  Remove throw rugs and tripping hazards from the floors. BEDROOMS  Install night lights.  Make sure a bedside light is easy to reach.  Do not use oversized bedding.  Keep a telephone by your bedside.  Have a firm chair with side arms to use for getting dressed.  Remove throw rugs and tripping hazards from the floor. KITCHEN  Keep handles on pots and pans turned toward the center of the stove. Use back burners when possible.  Clean up spills quickly and allow time for drying.  Avoid walking on wet floors.  Avoid hot utensils and knives.  Position shelves so they are not too high or low.  Place commonly used objects within easy reach.  If necessary, use a sturdy step stool with a grab bar when reaching.  Keep electrical cables out of the  way.  Do not use floor polish or wax that makes floors slippery. If you must use wax, use non-skid floor wax.  Remove throw rugs and tripping hazards from the floor. STAIRWAYS  Never leave objects on stairs.  Place handrails on both sides of stairways and use them. Fix any loose handrails. Make sure handrails on both sides of the stairways are as long as the stairs.  Check carpeting to make sure it is firmly attached along stairs. Make repairs to worn or loose carpet promptly.  Avoid placing throw rugs at the top or bottom of stairways, or properly secure the rug with carpet tape to prevent slippage. Get rid of throw rugs, if possible.  Have an electrician put in a light switch at the top and bottom of the stairs. OTHER FALL PREVENTION TIPS  Wear low-heel or rubber-soled shoes that are supportive and fit well. Wear closed toe shoes.  When using a stepladder, make sure it is fully opened and both spreaders are firmly locked. Do not climb a closed stepladder.  Add color or contrast paint or tape to grab bars and handrails in your home. Place contrasting color strips on first and last steps.  Learn and use mobility aids as needed. Install an electrical emergency response system.  Turn on lights to avoid dark areas. Replace light bulbs that burn out immediately. Get light switches that glow.  Arrange furniture to create clear pathways. Keep furniture in the same place.  Firmly attach carpet with non-skid or double-sided tape.  Eliminate uneven floor surfaces.  Select a carpet pattern that does not visually hide the edge of steps.  Be aware of all pets. OTHER HOME SAFETY TIPS  Set the water temperature for 120 F (48.8 C).  Keep emergency numbers on or near the telephone.  Keep smoke detectors on every level of the home and near sleeping areas. Document Released: 02/01/2002 Document Revised: 08/13/2011 Document Reviewed: 05/03/2011 [facility] Patient Information 2015  [place], [place]. This information is not intended to replace advice given to you by your health care provider. Make sure you discuss any questions you have with your health care provider.  Hip Pain Your hip is the joint between your upper legs and your lower pelvis. The bones, cartilage, tendons, and muscles of your hip joint perform a lot of work each day supporting your body weight and allowing you to move around. Hip pain can range from a minor ache to severe pain in one or both of your hips. Pain may be felt on the inside of the hip joint near the groin, or the outside near the buttocks and upper thigh. You may have swelling or stiffness as well.  HOME CARE INSTRUCTIONS   Take medicines only as directed by your health care provider.  Apply ice to the injured area:  Put ice in a plastic bag.  Place a towel between your skin and the bag.  Leave the ice on for 15-20 minutes at a time, 3-4 times a day.  Keep your leg raised (elevated) when possible to lessen swelling.  Avoid activities that cause pain.  Follow specific exercises as directed by your health care provider.  Sleep with a pillow between your legs on your most comfortable side.  Record how often you have hip pain, the location of the pain, and what it feels like. SEEK MEDICAL CARE IF:   You are unable to put weight on your leg.  Your hip is red or swollen or very tender to touch.  Your pain or swelling continues or worsens after 1 week.  You have increasing difficulty walking.  You have a fever. SEEK IMMEDIATE MEDICAL CARE IF:   You have fallen.  You have a sudden increase in pain and swelling in your hip. MAKE SURE YOU:   Understand these instructions.  Will watch your condition.  Will get help right away if you are not doing well or get worse. Document Released: 08/01/2009 Document Revised: 06/28/2013 Document Reviewed: 10/08/2012 [facility] Patient Information 2015 [place], [place]. This information is not  intended to replace advice given to you by your health care provider. Make sure you discuss any questions you have with your health care provider.   Your xrays tonight are negative for any acute injury including fractures or dislocation.  You may take the oxycodone prescribed for pain relief.  This will make you drowsy - do not drive within 4 hours of taking this medication. Call your orthopedic surgeon or your primary doctor for further evaluation if your symptoms persist.   Apply an  icepack  to your hip and pelvis area for 10-15 minutes every 2 hours for the next 2 days.  You may add a heating pad therapy 20 minutes several times daily starting on Thursday.

## 2014-01-18 NOTE — ED Notes (Signed)
Still requesting more pain medication. J.Idol PA with patient, offered him percocet, pt requesting the we give him 10 mg not 5.

## 2014-01-18 NOTE — ED Notes (Signed)
Requesting more dilaudid. "I can take 4mg  at a time with no problem" speech thick and slow s/p last dilaudid. ERPA notified

## 2014-01-18 NOTE — ED Notes (Signed)
Pt c/o left hip pain after bending down to fill dog water bowl.

## 2014-02-01 ENCOUNTER — Emergency Department (HOSPITAL_COMMUNITY): Payer: Medicaid Other

## 2014-02-01 ENCOUNTER — Encounter (HOSPITAL_COMMUNITY): Payer: Self-pay | Admitting: *Deleted

## 2014-02-01 ENCOUNTER — Emergency Department (HOSPITAL_COMMUNITY)
Admission: EM | Admit: 2014-02-01 | Discharge: 2014-02-01 | Disposition: A | Discharge status: Home / Self Care | Payer: Medicaid Other | Attending: Emergency Medicine | Admitting: Emergency Medicine

## 2014-02-01 DIAGNOSIS — S61441A Puncture wound with foreign body of right hand, initial encounter: Secondary | ICD-10-CM | POA: Insufficient documentation

## 2014-02-01 DIAGNOSIS — Z79899 Other long term (current) drug therapy: Secondary | ICD-10-CM | POA: Diagnosis not present

## 2014-02-01 DIAGNOSIS — Y998 Other external cause status: Secondary | ICD-10-CM | POA: Diagnosis not present

## 2014-02-01 DIAGNOSIS — Z7951 Long term (current) use of inhaled steroids: Secondary | ICD-10-CM | POA: Insufficient documentation

## 2014-02-01 DIAGNOSIS — R4781 Slurred speech: Secondary | ICD-10-CM | POA: Diagnosis not present

## 2014-02-01 DIAGNOSIS — Y9389 Activity, other specified: Secondary | ICD-10-CM | POA: Insufficient documentation

## 2014-02-01 DIAGNOSIS — Y9289 Other specified places as the place of occurrence of the external cause: Secondary | ICD-10-CM | POA: Diagnosis not present

## 2014-02-01 DIAGNOSIS — Z96649 Presence of unspecified artificial hip joint: Secondary | ICD-10-CM | POA: Insufficient documentation

## 2014-02-01 DIAGNOSIS — I10 Essential (primary) hypertension: Secondary | ICD-10-CM | POA: Insufficient documentation

## 2014-02-01 DIAGNOSIS — Z720 Tobacco use: Secondary | ICD-10-CM | POA: Insufficient documentation

## 2014-02-01 DIAGNOSIS — G8929 Other chronic pain: Secondary | ICD-10-CM | POA: Diagnosis not present

## 2014-02-01 DIAGNOSIS — Z8711 Personal history of peptic ulcer disease: Secondary | ICD-10-CM | POA: Diagnosis not present

## 2014-02-01 DIAGNOSIS — Z791 Long term (current) use of non-steroidal anti-inflammatories (NSAID): Secondary | ICD-10-CM | POA: Diagnosis not present

## 2014-02-01 DIAGNOSIS — W458XXA Other foreign body or object entering through skin, initial encounter: Secondary | ICD-10-CM | POA: Diagnosis not present

## 2014-02-01 DIAGNOSIS — M795 Residual foreign body in soft tissue: Secondary | ICD-10-CM

## 2014-02-01 DIAGNOSIS — F419 Anxiety disorder, unspecified: Secondary | ICD-10-CM | POA: Insufficient documentation

## 2014-02-01 MED ORDER — IBUPROFEN 800 MG PO TABS
800.0000 mg | ORAL_TABLET | Freq: Once | ORAL | Status: DC
  Filled 2014-02-01: qty 1

## 2014-02-01 MED ORDER — CEPHALEXIN 500 MG PO CAPS
500.0000 mg | ORAL_CAPSULE | Freq: Once | ORAL | Status: AC
  Administered 2014-02-01: 500 mg via ORAL
  Filled 2014-02-01: qty 1

## 2014-02-01 MED ORDER — NAPROXEN 500 MG PO TABS: ORAL_TABLET | ORAL | Status: DC

## 2014-02-01 MED ORDER — CEPHALEXIN 500 MG PO CAPS: 500.0000 mg | ORAL_CAPSULE | Freq: Three times a day (TID) | ORAL | Status: DC
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
## 2014-02-01 NOTE — ED Notes (Signed)
Patient left prior to receiving discharge instructions. Patient stated he was upset that he did not get anything for pain. Informed patient that he was offered Ibuprofen but refused. Patient stated he did not want ibuprofen because he could take that at home. Dr. Lynelle DoctorKnapp then informed patient to wait for prescription for antibiotics. Patient stated he did not want paperwork. Patient left.
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
## 2014-02-01 NOTE — Discharge Instructions (Signed)
Soak your hand in warm epsom salts 2-3 times a day. Use ice packs over the swollen area. Take the antibiotic until gone. Take the naproxen for pain.  Recheck if you get increased redness, swelling, see red streaks going up your arm, you see pus coming out of the puncture wound or it seems worse.

## 2014-06-23 ENCOUNTER — Other Ambulatory Visit (HOSPITAL_COMMUNITY): Payer: Self-pay | Admitting: Orthopedic Surgery

## 2014-06-23 DIAGNOSIS — T84038A Mechanical loosening of other internal prosthetic joint, initial encounter: Secondary | ICD-10-CM

## 2014-06-23 DIAGNOSIS — Z96649 Presence of unspecified artificial hip joint: Principal | ICD-10-CM

## 2014-06-29 ENCOUNTER — Encounter (HOSPITAL_COMMUNITY)
Admission: RE | Admit: 2014-06-29 | Discharge: 2014-06-29 | Disposition: A | Discharge status: Home / Self Care | Payer: Medicaid Other | Source: Ambulatory Visit | Attending: Orthopedic Surgery | Admitting: Orthopedic Surgery

## 2014-06-29 ENCOUNTER — Encounter (HOSPITAL_COMMUNITY): Payer: Self-pay

## 2014-06-29 DIAGNOSIS — T84038A Mechanical loosening of other internal prosthetic joint, initial encounter: Secondary | ICD-10-CM | POA: Insufficient documentation

## 2014-06-29 DIAGNOSIS — Z96649 Presence of unspecified artificial hip joint: Secondary | ICD-10-CM

## 2014-06-29 MED ORDER — TECHNETIUM TC 99M MEDRONATE IV KIT
25.0000 | PACK | Freq: Once | INTRAVENOUS | Status: AC | PRN
  Administered 2014-06-29: 25 via INTRAVENOUS

## 2014-08-01 ENCOUNTER — Emergency Department (HOSPITAL_COMMUNITY)
Admission: EM | Admit: 2014-08-01 | Discharge: 2014-08-01 | Disposition: A | Discharge status: Home / Self Care | Payer: Medicaid Other | Attending: Emergency Medicine | Admitting: Emergency Medicine

## 2014-08-01 ENCOUNTER — Encounter (HOSPITAL_COMMUNITY): Payer: Self-pay | Admitting: Emergency Medicine

## 2014-08-01 ENCOUNTER — Emergency Department (HOSPITAL_COMMUNITY): Payer: Medicaid Other

## 2014-08-01 DIAGNOSIS — Z791 Long term (current) use of non-steroidal anti-inflammatories (NSAID): Secondary | ICD-10-CM | POA: Insufficient documentation

## 2014-08-01 DIAGNOSIS — S99911A Unspecified injury of right ankle, initial encounter: Secondary | ICD-10-CM | POA: Diagnosis present

## 2014-08-01 DIAGNOSIS — Z8711 Personal history of peptic ulcer disease: Secondary | ICD-10-CM | POA: Diagnosis not present

## 2014-08-01 DIAGNOSIS — Z79899 Other long term (current) drug therapy: Secondary | ICD-10-CM | POA: Insufficient documentation

## 2014-08-01 DIAGNOSIS — Z720 Tobacco use: Secondary | ICD-10-CM | POA: Diagnosis not present

## 2014-08-01 DIAGNOSIS — Y9289 Other specified places as the place of occurrence of the external cause: Secondary | ICD-10-CM | POA: Diagnosis not present

## 2014-08-01 DIAGNOSIS — S93401A Sprain of unspecified ligament of right ankle, initial encounter: Secondary | ICD-10-CM | POA: Diagnosis not present

## 2014-08-01 DIAGNOSIS — G8929 Other chronic pain: Secondary | ICD-10-CM | POA: Insufficient documentation

## 2014-08-01 DIAGNOSIS — Y998 Other external cause status: Secondary | ICD-10-CM | POA: Diagnosis not present

## 2014-08-01 DIAGNOSIS — Y9389 Activity, other specified: Secondary | ICD-10-CM | POA: Insufficient documentation

## 2014-08-01 DIAGNOSIS — F419 Anxiety disorder, unspecified: Secondary | ICD-10-CM | POA: Diagnosis not present

## 2014-08-01 DIAGNOSIS — W108XXA Fall (on) (from) other stairs and steps, initial encounter: Secondary | ICD-10-CM | POA: Insufficient documentation

## 2014-08-01 DIAGNOSIS — Z7952 Long term (current) use of systemic steroids: Secondary | ICD-10-CM | POA: Insufficient documentation

## 2014-08-01 DIAGNOSIS — I10 Essential (primary) hypertension: Secondary | ICD-10-CM | POA: Insufficient documentation

## 2014-08-01 MED ORDER — MELOXICAM 15 MG PO TABS: 15.0000 mg | ORAL_TABLET | Freq: Every day | ORAL | Status: DC

## 2014-08-01 MED ORDER — IBUPROFEN 800 MG PO TABS
800.0000 mg | ORAL_TABLET | Freq: Once | ORAL | Status: AC
  Administered 2014-08-01: 800 mg via ORAL
  Filled 2014-08-01: qty 1
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
## 2014-08-01 NOTE — Discharge Instructions (Signed)
The x-ray of your ankle is negative for fracture or dislocation. Please use the ankle splint for the next 5 to 10 days. Ankle Sprain An ankle sprain is an injury to the strong, fibrous tissues (ligaments) that hold your ankle bones together.  HOME CARE   Put ice on your ankle for 1-2 days or as told by your doctor.  Put ice in a plastic bag.  Place a towel between your skin and the bag.  Leave the ice on for 15-20 minutes at a time, every 2 hours while you are awake.  Only take medicine as told by your doctor.  Raise (elevate) your injured ankle above the level of your heart as much as possible for 2-3 days.  Use crutches if your doctor tells you to. Slowly put your own weight on the affected ankle. Use the crutches until you can walk without pain.  If you have a plaster splint:  Do not rest it on anything harder than a pillow for 24 hours.  Do not put weight on it.  Do not get it wet.  Take it off to shower or bathe.  If given, use an elastic wrap or support stocking for support. Take the wrap off if your toes lose feeling (numb), tingle, or turn cold or blue.  If you have an air splint:  Add or let out air to make it comfortable.  Take it off at night and to shower and bathe.  Wiggle your toes and move your ankle up and down often while you are wearing it. GET HELP IF:  You have rapidly increasing bruising or puffiness (swelling).  Your toes feel very cold.  You lose feeling in your foot.  Your medicine does not help your pain. GET HELP RIGHT AWAY IF:   Your toes lose feeling (numb) or turn blue.  You have severe pain that is increasing. MAKE SURE YOU:   Understand these instructions.  Will watch your condition.  Will get help right away if you are not doing well or get worse. Document Released: 07/31/2007 Document Revised: 06/28/2013 Document Reviewed: 08/26/2011 [facility] Patient Information 2015 [place], [place]. This information is not intended to  replace advice given to you by your health care provider. Make sure you discuss any questions you have with your health care provider. Please elevate your ankle above your waist. Please apply ice. Please add low back to your current medications. Please see your orthopedic specialist for additional evaluation if not improving.

## 2015-02-16 IMAGING — CR DG SACRUM/COCCYX 2+V
3 series · 3 of 3 positions shown · non-contrast
Comparison: Right hip 09/26/2013

CLINICAL DATA: Low back pain and hip pain after a fall today.

EXAM:
SACRUM AND COCCYX - 2+ VIEW

[view not recorded (1 of 3)]
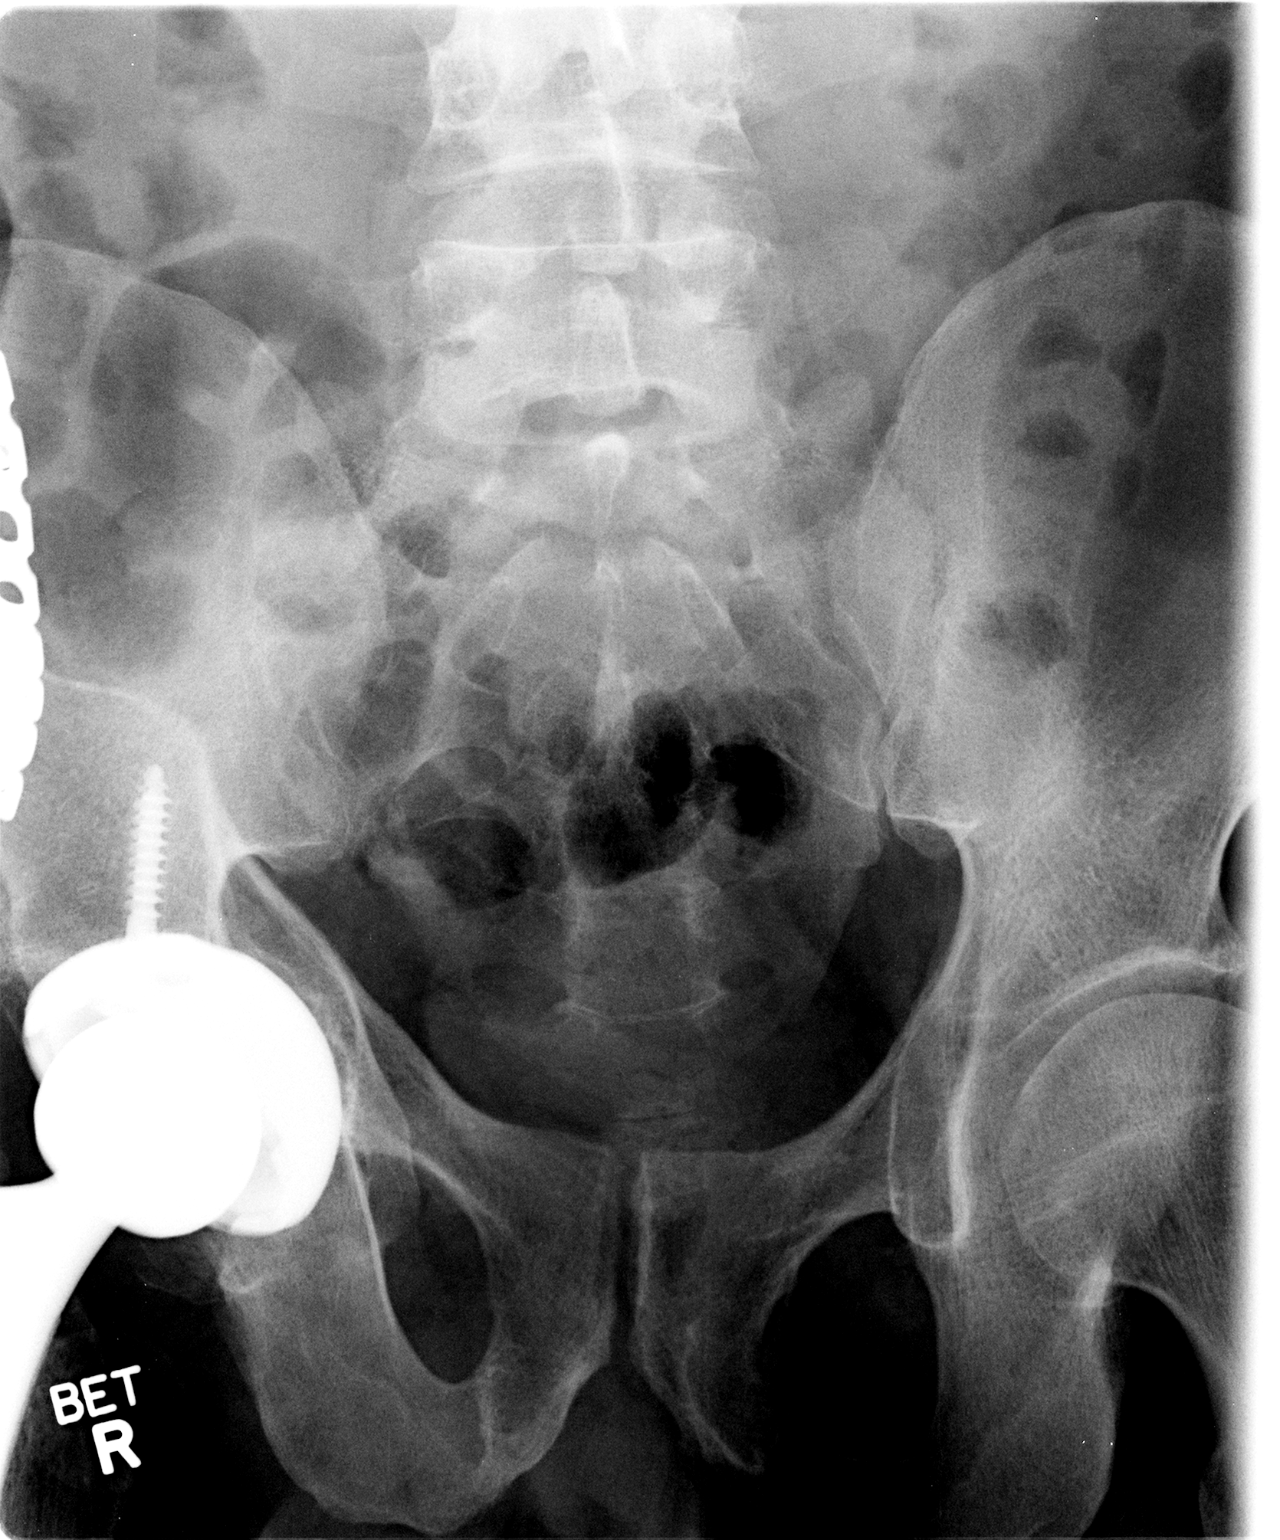

[view not recorded (2 of 3)]
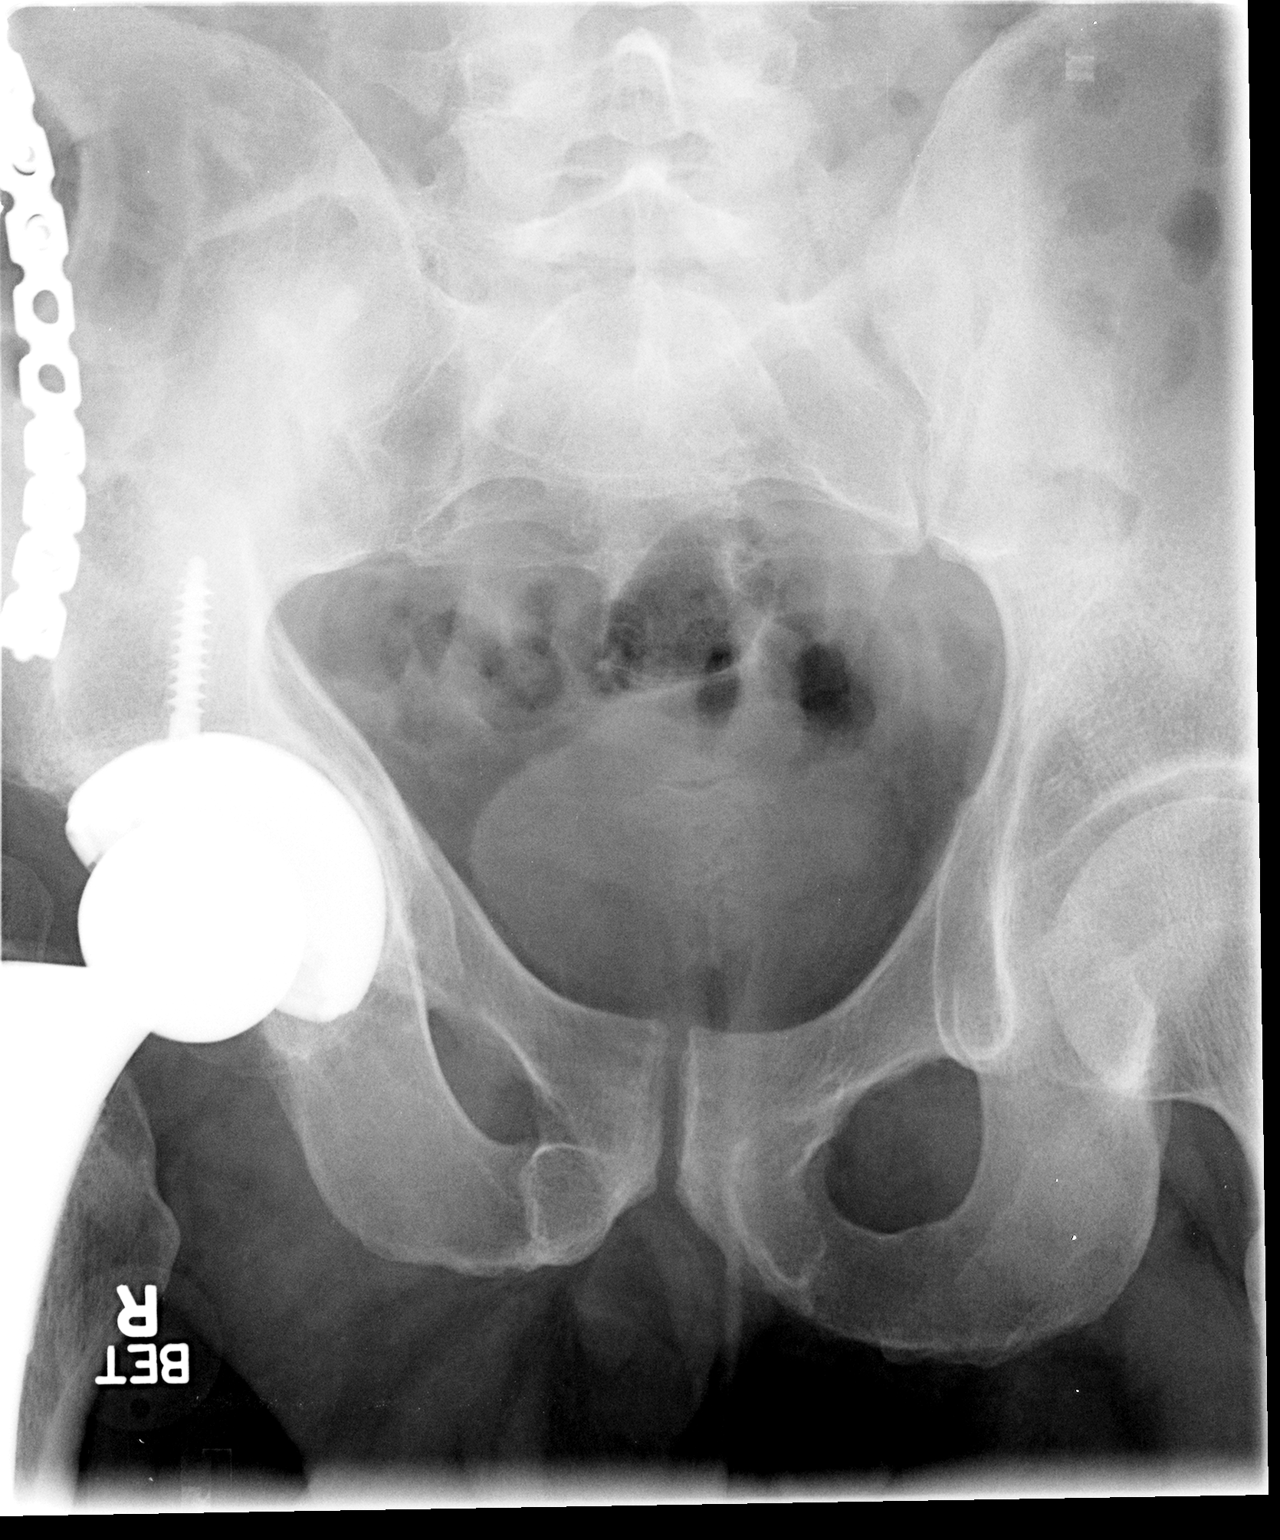

[view not recorded (3 of 3)]
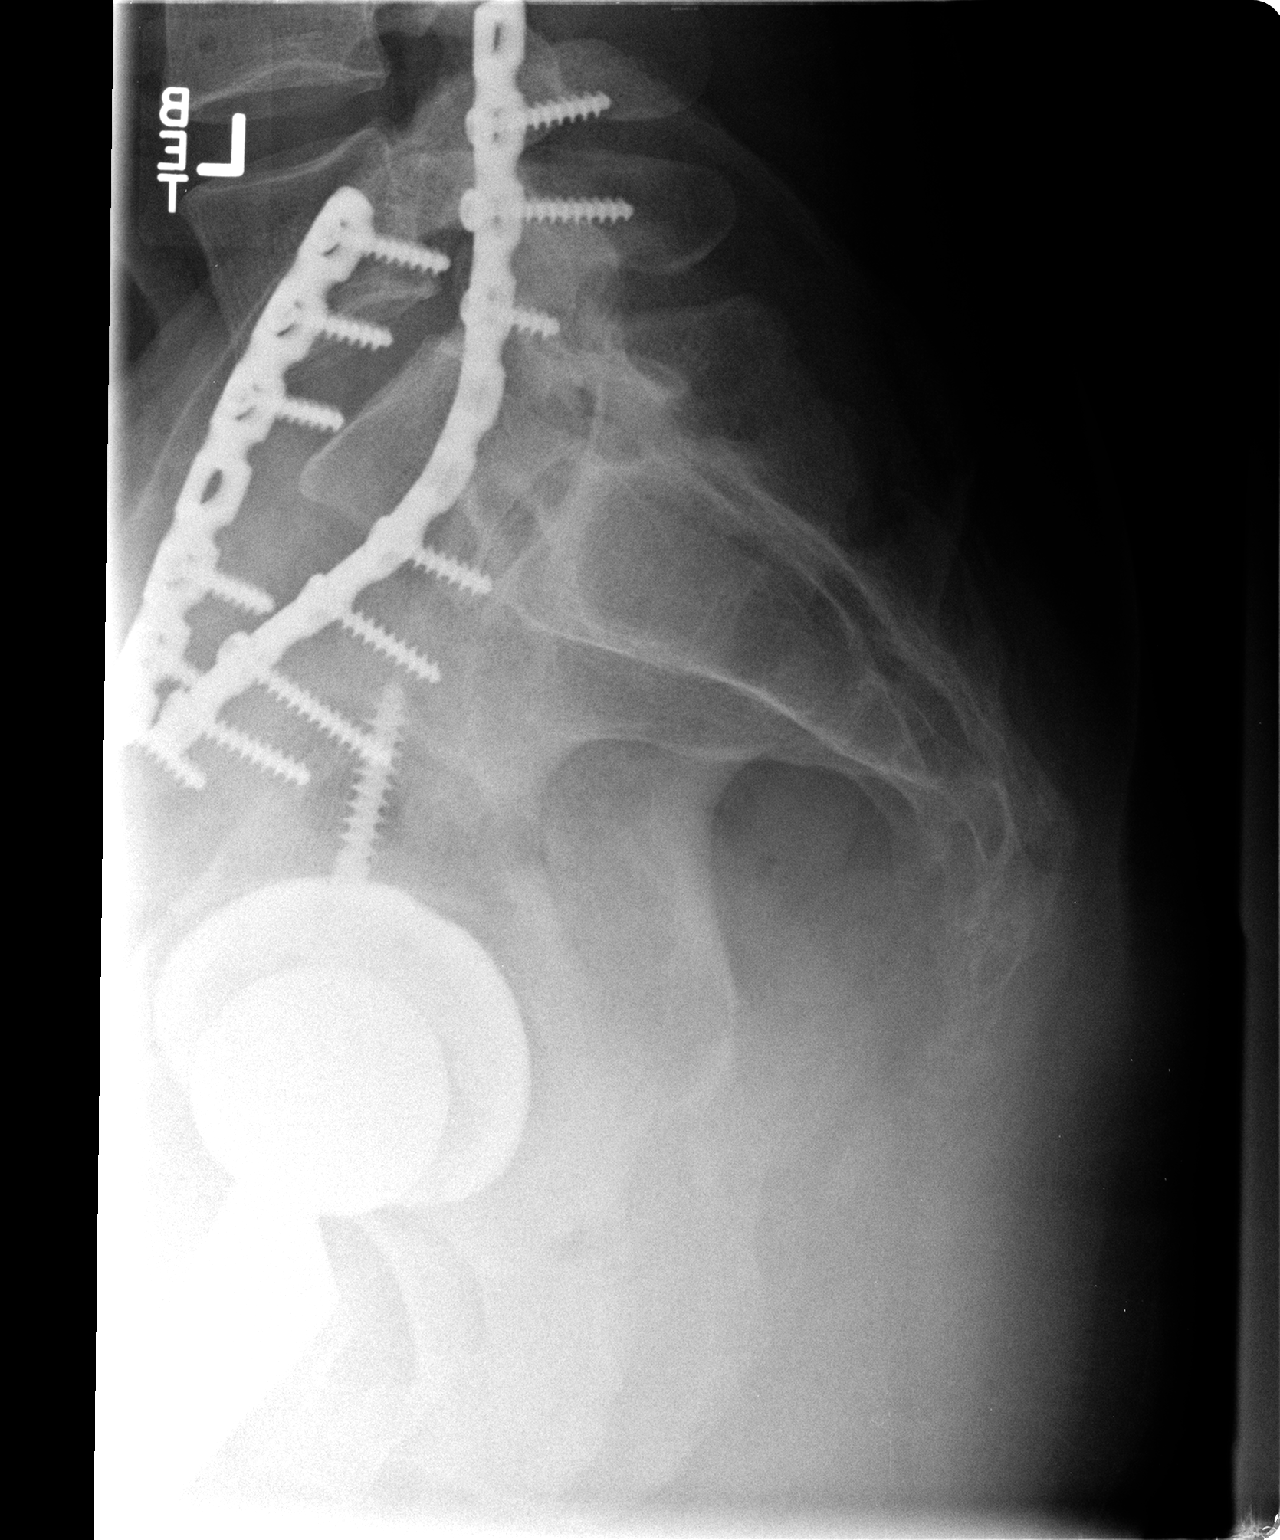

[3 of 3 positions shown; findings below may reference images not displayed]

FINDINGS: There is no evidence of fracture or other focal bone lesions.
Previous postoperative changes in the right iliac bone and right
hip.
IMPRESSION: No acute bony abnormalities.

## 2015-04-23 ENCOUNTER — Emergency Department (HOSPITAL_COMMUNITY)
Admission: EM | Admit: 2015-04-23 | Discharge: 2015-04-23 | Disposition: A | Discharge status: Home / Self Care | Payer: Medicaid Other | Attending: Emergency Medicine | Admitting: Emergency Medicine

## 2015-04-23 ENCOUNTER — Encounter (HOSPITAL_COMMUNITY): Payer: Self-pay | Admitting: Emergency Medicine

## 2015-04-23 DIAGNOSIS — I10 Essential (primary) hypertension: Secondary | ICD-10-CM | POA: Insufficient documentation

## 2015-04-23 DIAGNOSIS — R0602 Shortness of breath: Secondary | ICD-10-CM | POA: Diagnosis present

## 2015-04-23 DIAGNOSIS — F17210 Nicotine dependence, cigarettes, uncomplicated: Secondary | ICD-10-CM | POA: Diagnosis not present

## 2015-04-23 DIAGNOSIS — G8929 Other chronic pain: Secondary | ICD-10-CM | POA: Diagnosis not present

## 2015-04-23 NOTE — ED Notes (Signed)
Pt comes from bathroom, Charge nurse smelled smoke, pt was ask and denies smoking.   Pt back in room. Strong smell of smoke in department.

## 2015-04-23 NOTE — ED Notes (Signed)
Pt complaining wanting to be seen, advise of delay

## 2015-04-23 NOTE — ED Notes (Signed)
Pt continues to complain about the wait, pt states he came in EMS and needs to be seen. Pt repeats complains of dx of nodules in Aug and has not seen a doctor. Advised pt of delay.

## 2015-04-23 NOTE — ED Notes (Signed)
Pt states he needs to go walk around, states he is tired of sitting, and getting claustrophilia, advise pt to wait, MD would be around asap.

## 2015-04-23 NOTE — ED Notes (Signed)
Ems, nodule to right upper, possible cancer dx, pt continues to smoke and has not followed up with oncology. Pain got worse tonight and complains of SOB.

## 2015-04-23 NOTE — ED Notes (Signed)
Pt calling out, vitals checked and updated, of stable vital, pt is 98% continue to curse and complain, advise MD would be in asap, cursing and grabs coat and walks out of department.

## 2015-04-23 NOTE — ED Notes (Signed)
Had to call security to come to stand in hallway.

## 2015-04-23 NOTE — ED Notes (Signed)
Pt states pain is worse when takes deep breathes

## 2015-10-04 IMAGING — DX DG ANKLE COMPLETE 3+V*R*
3 series · 3 of 3 positions shown · non-contrast
Comparison: None

CLINICAL DATA: Right-sided ankle pain for 4 days

EXAM:
RIGHT ANKLE - COMPLETE 3+ VIEW

[ankle ap]
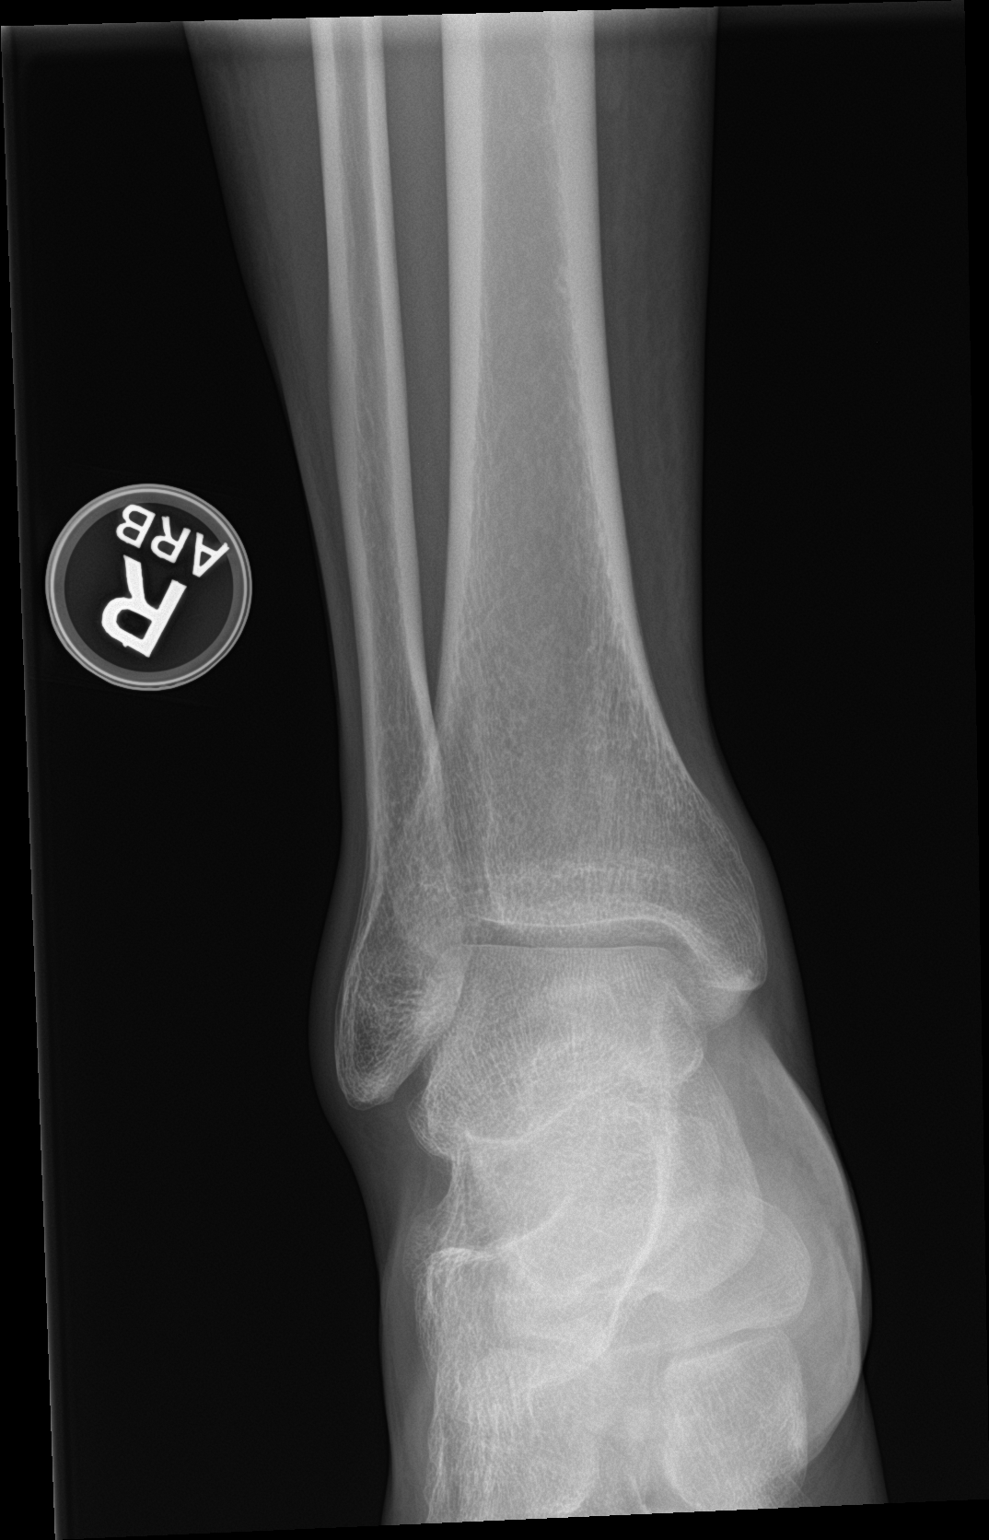

[ankle obl]
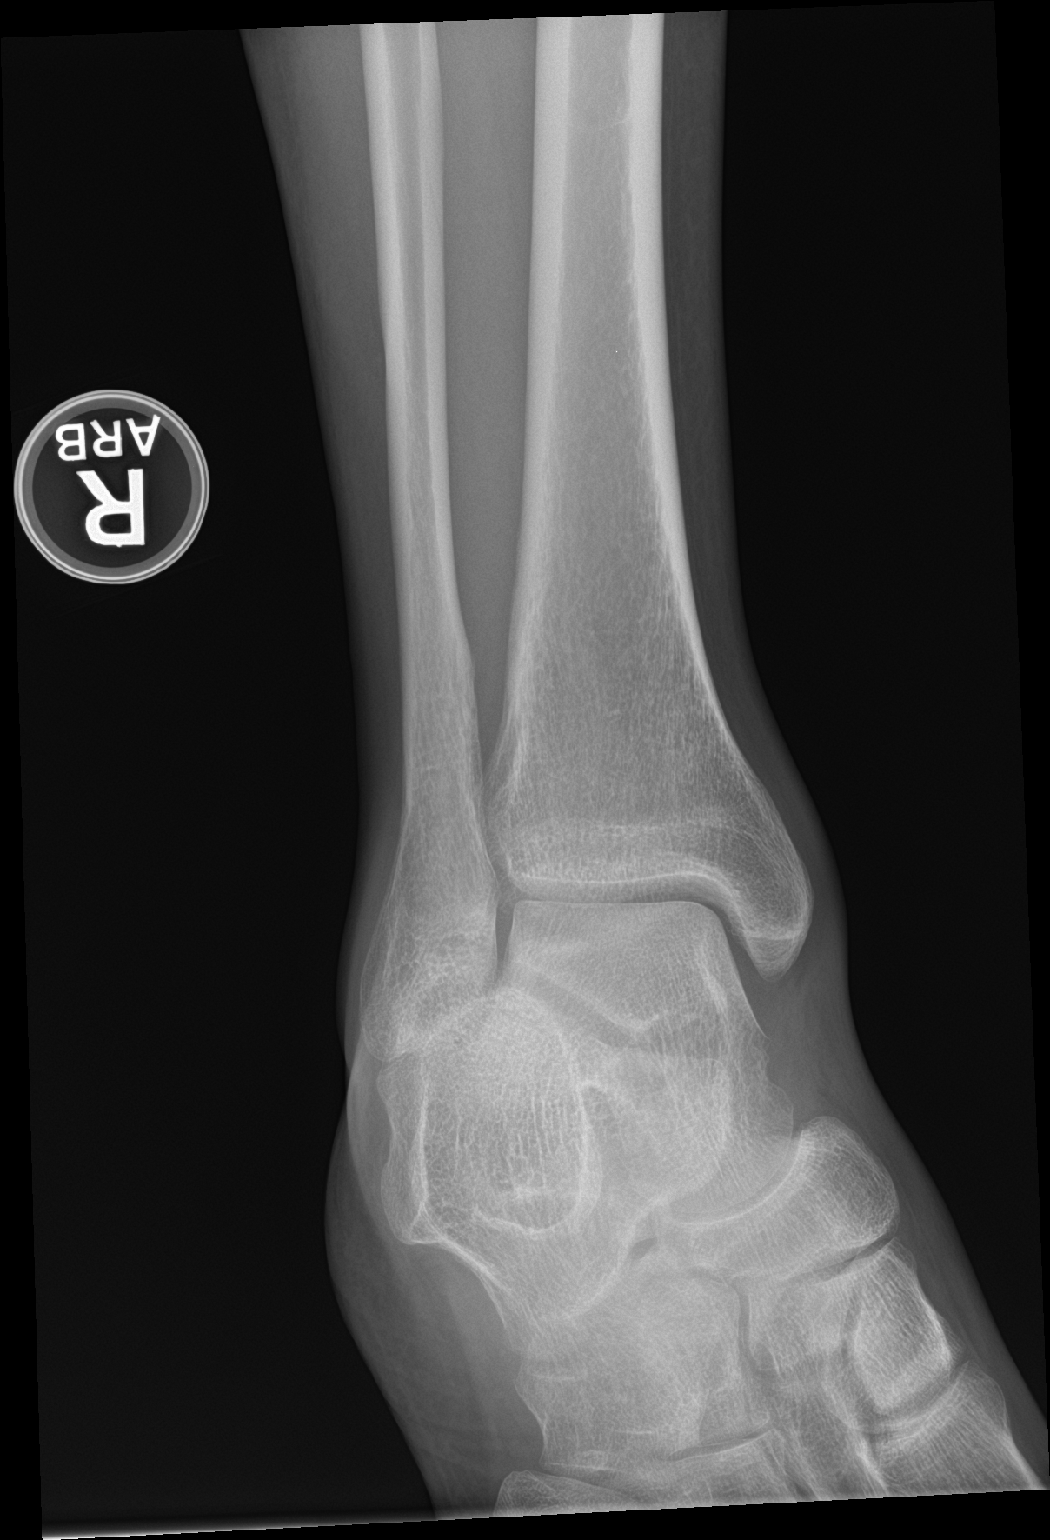

[ankle lat]
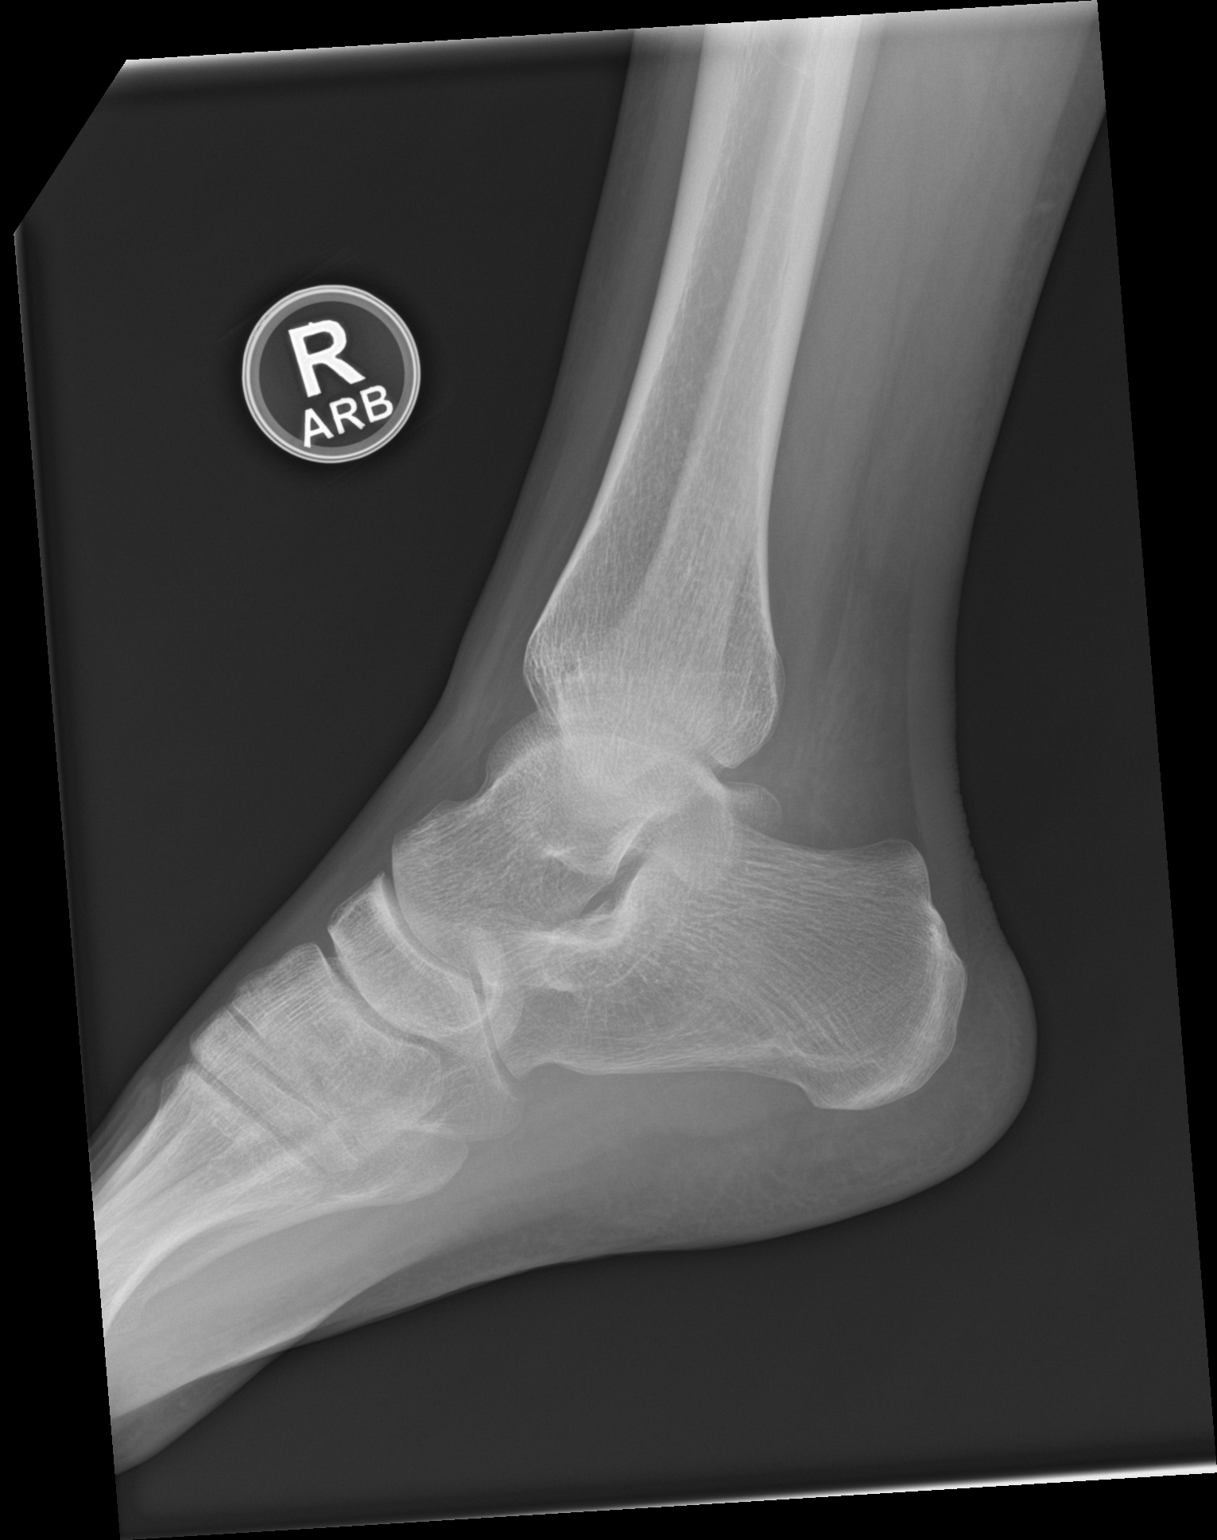

[3 of 3 positions shown; findings below may reference images not displayed]

FINDINGS: There is no evidence of fracture, dislocation, or joint effusion.
There is no evidence of arthropathy or other focal bone abnormality.
Soft tissues are unremarkable.
IMPRESSION: Negative.

## 2016-07-21 ENCOUNTER — Emergency Department (HOSPITAL_COMMUNITY)
Admission: EM | Admit: 2016-07-21 | Discharge: 2016-07-21 | Disposition: A | Discharge status: Home / Self Care | Payer: Medicaid Other | Attending: Emergency Medicine | Admitting: Emergency Medicine

## 2016-07-21 ENCOUNTER — Encounter (HOSPITAL_COMMUNITY): Payer: Self-pay | Admitting: Emergency Medicine

## 2016-07-21 DIAGNOSIS — F1290 Cannabis use, unspecified, uncomplicated: Secondary | ICD-10-CM | POA: Insufficient documentation

## 2016-07-21 DIAGNOSIS — I10 Essential (primary) hypertension: Secondary | ICD-10-CM | POA: Insufficient documentation

## 2016-07-21 DIAGNOSIS — F17210 Nicotine dependence, cigarettes, uncomplicated: Secondary | ICD-10-CM | POA: Diagnosis not present

## 2016-07-21 DIAGNOSIS — G5631 Lesion of radial nerve, right upper limb: Secondary | ICD-10-CM | POA: Diagnosis not present

## 2016-07-21 DIAGNOSIS — Z79899 Other long term (current) drug therapy: Secondary | ICD-10-CM | POA: Insufficient documentation

## 2016-07-21 DIAGNOSIS — R200 Anesthesia of skin: Secondary | ICD-10-CM | POA: Diagnosis present

## 2016-07-21 MED ORDER — TRAMADOL HCL 50 MG PO TABS: 50.0000 mg | ORAL_TABLET | Freq: Four times a day (QID) | ORAL | 0 refills | Status: AC | PRN

## 2016-07-21 NOTE — ED Triage Notes (Signed)
Pt states he woke up one morning with his right arm hurting. States this has been going on for 3 months. States he saw his PCP and told it was carpel tunnel. Pt states according to the Internet is is Ulnar nerve palsy. Pt states his R arm is also gotten shorter and his wrist hurts and from his elbow to shoulder throbs like a toothache.

## 2016-07-21 NOTE — ED Notes (Signed)
Pt ambulatory to waiting room. Pt verbalized understanding of discharge instructions.   

## 2016-07-21 NOTE — Discharge Instructions (Signed)
Take acetaminophen as needed for less severe pain. Wear your wrist brace at all times.

## 2016-07-21 NOTE — ED Provider Notes (Addendum)
[facility]-EMERGENCY DEPT Provider Note   CSN: [id number] Arrival date & time: 07/21/16  0139     History   Chief Complaint Chief Complaint  Patient presents with  . Arm Pain    HPI Eddie Cole is a 41 y.o. male.  The history is provided by the patient.  He complains of numbness and weakness in his right arm over the last 3 weeks. Symptoms are getting worse. It started with some numbness in his right forearm which has spread up towards his upper arm and also into his hand. He is also noticed some weakness of his right wrist and has had to wear a wrist brace. Is complaining of pain throughout that his forearm and upper arm which he rates at 8/10. He does have a job involving a lot of repetitive motion. He had seen his PCP who had diagnosed carpal tunnel. Patient is concerned that he may actually have an ulnar nerve problem based on looking at the Internet.  Past Medical History:  Diagnosis Date  . Anxiety   . Balance problem   . Chronic pain    Hip/back  . HTN (hypertension)   . Nervousness   . PUD (peptic ulcer disease)     Patient Active Problem List   Diagnosis Date Noted  . Chest pain 09/19/2010  . Tobacco abuse 09/19/2010  . Hypertension 09/19/2010    Past Surgical History:  Procedure Laterality Date  . fx pelvis    . HIP SURGERY     ORIF  . JOINT REPLACEMENT         Home Medications    Prior to Admission medications   Medication Sig Start Date End Date Taking? Authorizing Provider  acetaminophen-codeine (TYLENOL #3) 300-30 MG per tablet Take 1-2 tablets by mouth every 6 (six) hours as needed for moderate pain. 11/18/13   Ivery Quale, PA-C  cephALEXin (KEFLEX) 500 MG capsule Take 1 capsule (500 mg total) by mouth 3 (three) times daily. 02/01/14   Devoria Albe, MD  dexamethasone (DECADRON) 6 MG tablet 1 po bid with food 11/18/13   Ivery Quale, PA-C  diazepam (VALIUM) 10 MG tablet Take 10 mg by mouth 4 (four) times daily as needed. For anxiety     [provider]  diclofenac (VOLTAREN) 75 MG EC tablet Take 1 tablet (75 mg total) by mouth 2 (two) times daily. 11/18/13   Ivery Quale, PA-C  gabapentin (NEURONTIN) 100 MG capsule Take 100 mg by mouth 3 (three) times daily. 09/29/13 09/29/14  [provider]  meloxicam (MOBIC) 15 MG tablet Take 1 tablet (15 mg total) by mouth daily. 08/01/14   Ivery Quale, PA-C  methylPREDNISolone (MEDROL DOSEPAK) 4 MG tablet Take 1 tablet by mouth daily. 09/29/13   [provider]  naproxen (NAPROSYN) 500 MG tablet Take 1 po BID with food prn pain 02/01/14   Devoria Albe, MD  omeprazole (PRILOSEC) 20 MG capsule Take 20 mg by mouth daily.    [provider]  oxyCODONE-acetaminophen (PERCOCET/ROXICET) 5-325 MG per tablet Take 1 tablet by mouth every 4 (four) hours as needed. 01/18/14   Burgess Amor, PA-C  quinapril (ACCUPRIL) 20 MG tablet Take 1 tablet by mouth daily. 09/27/13   [provider]  sucralfate (CARAFATE) 1 G tablet Take 1 g by mouth 2 (two) times daily.    [provider]  traMADol (ULTRAM) 50 MG tablet Take 1 tablet (50 mg total) by mouth every 6 (six) hours as needed. 12/14/13   Dione Booze, MD  Family History Family History  Problem Relation Age of Onset  . Neuropathy Mother   . Ulcers Mother   . Coronary artery disease Paternal Grandfather [id number]       MI  . Other Sister        stomach problems  . Emphysema Maternal Grandmother   . Congestive Heart Failure Maternal Grandfather   . Diabetes Paternal Grandmother     Social History Social History  Substance Use Topics  . Smoking status: Current Every Day Smoker    Packs/day: 0.25    Years: 14.00    Types: Cigarettes  . Smokeless tobacco: Never Used  . Alcohol use Yes     Comment: occasionally     Allergies   Morphine and related   Review of Systems Review of Systems  All other systems reviewed and are negative.    Physical Exam Updated Vital Signs BP (!) 132/51 (BP Location:  Left Arm)   Pulse 84   Temp 97.5 F (36.4 C) (Oral)   Resp 18   Ht 5\' 10"  (1.778 m)   Wt 74.8 kg (165 lb)   SpO2 99%   BMI 23.68 kg/m   Physical Exam  Nursing note and vitals reviewed.  41 year old male, resting comfortably and in no acute distress. Vital signs are normal. Oxygen saturation is 99%, which is normal. Head is normocephalic and atraumatic. PERRLA, EOMI. Oropharynx is clear. Neck is nontender and supple without adenopathy or JVD. Back is nontender and there is no CVA tenderness. Lungs are clear without rales, wheezes, or rhonchi. Chest is nontender. Heart has regular rate and rhythm without murmur. Abdomen is soft, flat, nontender without masses or hepatosplenomegaly and peristalsis is normoactive. Extremities have no cyanosis or edema, full range of motion is present. Skin is warm and dry without rash. Neurologic: Mental status is normal, cranial nerves are intact. He has a right wrist drop and weakness of all movements of his fingers on the right hand but normal strength on right hand pincer grasp. Normal strength of elbow flexion and extension on the right. Decreased sensation throughout his right arm compared with the left with the exception of normal sensation over the thenar area.  ED Treatments / Results   Procedures Procedures (including critical care time)  Medications Ordered in ED Medications - No data to display   Initial Impression / Assessment and Plan / ED Course  I have reviewed the triage vital signs and the nursing notes.  Right radial no palsy. Unfortunately, he has a history of ulcers and cannot take NSAIDs. He is given a prescription for tramadol for pain and is referred to neurology for further outpatient workup. No indication for lab work or imaging in the ED.  Final Clinical Impressions(s) / ED Diagnoses   Final diagnoses:  Radial nerve palsy, right    New Prescriptions Current Discharge Medication List       Dione BoozeGlick, Gladie Gravette,  MD 07/21/16 0249    Dione BoozeGlick, Kahlan Engebretson, MD 07/21/16 [phone]

## 2017-05-27 ENCOUNTER — Emergency Department (HOSPITAL_COMMUNITY): Payer: Self-pay

## 2017-05-27 ENCOUNTER — Other Ambulatory Visit: Payer: Self-pay

## 2017-05-27 ENCOUNTER — Encounter (HOSPITAL_COMMUNITY): Payer: Self-pay | Admitting: Emergency Medicine

## 2017-05-27 ENCOUNTER — Emergency Department (HOSPITAL_COMMUNITY)
Admission: EM | Admit: 2017-05-27 | Discharge: 2017-05-27 | Disposition: A | Discharge status: Home / Self Care | Payer: Self-pay | Attending: Emergency Medicine | Admitting: Emergency Medicine

## 2017-05-27 DIAGNOSIS — F17210 Nicotine dependence, cigarettes, uncomplicated: Secondary | ICD-10-CM | POA: Insufficient documentation

## 2017-05-27 DIAGNOSIS — I10 Essential (primary) hypertension: Secondary | ICD-10-CM | POA: Insufficient documentation

## 2017-05-27 DIAGNOSIS — R072 Precordial pain: Secondary | ICD-10-CM | POA: Insufficient documentation

## 2017-05-27 DIAGNOSIS — Z79899 Other long term (current) drug therapy: Secondary | ICD-10-CM | POA: Insufficient documentation

## 2017-05-27 DIAGNOSIS — F419 Anxiety disorder, unspecified: Secondary | ICD-10-CM | POA: Insufficient documentation

## 2017-05-27 LAB — BASIC METABOLIC PANEL
ANION GAP: 13 (ref 5–15)
BUN: 18 mg/dL (ref 6–20)
CALCIUM: 9.7 mg/dL (ref 8.9–10.3)
CO2: 24 mmol/L (ref 22–32)
CREATININE: 0.77 mg/dL (ref 0.61–1.24)
Chloride: 102 mmol/L (ref 101–111)
GFR calc non Af Amer: >60 mL/min (ref >60)
GLUCOSE: 147 mg/dL — AB (ref 65–99)
Potassium: 3.9 mmol/L (ref 3.5–5.1)
Sodium: 139 mmol/L (ref 135–145)

## 2017-05-27 LAB — CBC
HCT: 47.9 % (ref 39.0–52.0)
HEMOGLOBIN: 15.9 g/dL (ref 13.0–17.0)
MCH: 31.1 pg (ref 26.0–34.0)
MCHC: 33.2 g/dL (ref 30.0–36.0)
MCV: 93.7 fL (ref 78.0–100.0)
PLATELETS: 240 10*3/uL (ref 150–400)
RBC: 5.11 MIL/uL (ref 4.22–5.81)
RDW: 13.1 % (ref 11.5–15.5)
WBC: 8 10*3/uL (ref 4.0–10.5)

## 2017-05-27 LAB — TROPONIN I

## 2017-05-27 MED ORDER — GI COCKTAIL ~~LOC~~
30.0000 mL | Freq: Once | ORAL | Status: AC
Start: 2017-05-27 — End: 2017-05-27
  Administered 2017-05-27: 30 mL via ORAL
  Filled 2017-05-27: qty 30

## 2017-05-27 MED ORDER — FAMOTIDINE 20 MG PO TABS
20.0000 mg | ORAL_TABLET | Freq: Once | ORAL | Status: AC
  Administered 2017-05-27: 20 mg via ORAL
  Filled 2017-05-27: qty 1

## 2017-05-27 MED ORDER — ACETAMINOPHEN 500 MG PO TABS
1000.0000 mg | ORAL_TABLET | Freq: Once | ORAL | Status: AC
  Administered 2017-05-27: 1000 mg via ORAL
  Filled 2017-05-27: qty 2

## 2017-05-27 MED ORDER — LORAZEPAM 2 MG/ML IJ SOLN
1.0000 mg | Freq: Once | INTRAMUSCULAR | Status: AC
  Administered 2017-05-27: 1 mg via INTRAVENOUS
  Filled 2017-05-27: qty 1

## 2017-05-27 NOTE — ED Notes (Signed)
Patient transported to X-ray 

## 2017-05-27 NOTE — Discharge Instructions (Signed)
It was our pleasure to provide your ER care today - we hope that you feel better.  For chest discomfort, follow up with cardiologist in the coming week - see referral - call office to arrange appointment.   Return to ER if worse, new symptoms, fevers, increased trouble breathing, recurrent/persistent chest pain, other concern.   You were given medication in the ER - no driving for the next 4 hours.

## 2017-05-27 NOTE — ED Provider Notes (Signed)
[facility] EMERGENCY DEPARTMENT Provider Note   CSN: [id number] Arrival date & time: 05/27/17  0730     History   Chief Complaint Chief Complaint  Patient presents with  . Chest Pain    HPI KIOWA HOLLAR is a 42 y.o. male.  Patient c/o chest discomfort for the past day. Occurs at rest. Mid chest, constant, non radiating, mild. No associated sob, nv or diaphoresis. Pain is not pleuritic. No exertional cp or discomfort. No unusual doe. Denies leg pain or swelling. +fam hx cad, parent/grandparent, dad in 42s w MI. States prior stress test several yrs ago was ok. +smoker. Pt has noted some recent reflux symptoms, heartburn, at rest, mild.   The history is provided by the patient.  Chest Pain   Pertinent negatives include no abdominal pain, no cough, no fever, no headaches, no shortness of breath and no vomiting.    Past Medical History:  Diagnosis Date  . Anxiety   . Balance problem   . Chronic pain    Hip/back  . HTN (hypertension)   . Nervousness   . PUD (peptic ulcer disease)     Patient Active Problem List   Diagnosis Date Noted  . Chest pain 09/19/2010  . Tobacco abuse 09/19/2010  . Hypertension 09/19/2010    Past Surgical History:  Procedure Laterality Date  . fx pelvis    . HIP SURGERY     ORIF  . JOINT REPLACEMENT          Home Medications    Prior to Admission medications   Medication Sig Start Date End Date Taking? Authorizing Provider  acetaminophen-codeine (TYLENOL #3) 300-30 MG per tablet Take 1-2 tablets by mouth every 6 (six) hours as needed for moderate pain. 11/18/13   Ivery Quale, PA-C  gabapentin (NEURONTIN) 100 MG capsule Take 100 mg by mouth 3 (three) times daily. 09/29/13 09/29/14  [provider]  omeprazole (PRILOSEC) 20 MG capsule Take 20 mg by mouth daily.    [provider]  quinapril (ACCUPRIL) 20 MG tablet Take 1 tablet by mouth daily. 09/27/13   [provider]  sucralfate (CARAFATE) 1 G tablet  Take 1 g by mouth 2 (two) times daily.    [provider]  traMADol (ULTRAM) 50 MG tablet Take 1 tablet (50 mg total) by mouth every 6 (six) hours as needed. 07/21/16   Dione Booze, MD    Family History Family History  Problem Relation Age of Onset  . Neuropathy Mother   . Ulcers Mother   . Coronary artery disease Paternal Grandfather 32       MI  . Other Sister        stomach problems  . Emphysema Maternal Grandmother   . Congestive Heart Failure Maternal Grandfather   . Diabetes Paternal Grandmother     Social History Social History   Tobacco Use  . Smoking status: Current Every Day Smoker    Packs/day: 0.25    Years: 14.00    Pack years: 3.50    Types: Cigarettes  . Smokeless tobacco: Never Used  Substance Use Topics  . Alcohol use: Yes    Comment: occasionally  . Drug use: Yes    Types: Marijuana    Comment: occasionally     Allergies   Morphine and related   Review of Systems Review of Systems  Constitutional: Negative for fever.  HENT: Negative for sore throat.   Eyes: Negative for redness.  Respiratory: Negative for cough and shortness of  breath.   Cardiovascular: Positive for chest pain. Negative for leg swelling.  Gastrointestinal: Negative for abdominal pain and vomiting.  Genitourinary: Negative for flank pain.  Musculoskeletal: Negative for neck pain.  Skin: Negative for rash.  Neurological: Negative for headaches.  Hematological: Does not bruise/bleed easily.  Psychiatric/Behavioral: Negative for confusion.     Physical Exam Updated Vital Signs Ht 1.778 m (5\' 10" )   Wt 79.4 kg (175 lb)   BMI 25.11 kg/m   Physical Exam  Constitutional: He appears well-developed and well-nourished. No distress.  HENT:  Mouth/Throat: Oropharynx is clear and moist.  Eyes: Conjunctivae are normal.  Neck: Neck supple. No tracheal deviation present.  Cardiovascular: Normal rate, regular rhythm, normal heart sounds and intact distal pulses. Exam  reveals no gallop and no friction rub.  No murmur heard. Pulmonary/Chest: Effort normal and breath sounds normal. No accessory muscle usage. No respiratory distress.  Abdominal: Soft. Bowel sounds are normal. He exhibits no distension. There is no tenderness.  Musculoskeletal: He exhibits no edema or tenderness.  Neurological: He is alert.  Skin: Skin is warm and dry. He is not diaphoretic.  Psychiatric: He has a normal mood and affect.  Nursing note and vitals reviewed.    ED Treatments / Results  Labs (all labs ordered are listed, but only abnormal results are displayed) Results for orders placed or performed during the hospital encounter of 05/27/17  Basic metabolic panel  Result Value Ref Range   Sodium 139 135 - 145 mmol/L   Potassium 3.9 3.5 - 5.1 mmol/L   Chloride 102 101 - 111 mmol/L   CO2 24 22 - 32 mmol/L   Glucose, Bld 147 (H) 65 - 99 mg/dL   BUN 18 6 - 20 mg/dL   Creatinine, Ser 4.09 0.61 - 1.24 mg/dL   Calcium 9.7 8.9 - 81.1 mg/dL   GFR calc non Af Amer >60 >60 mL/min   GFR calc Af Amer >60 >60 mL/min   Anion gap 13 5 - 15  CBC  Result Value Ref Range   WBC 8.0 4.0 - 10.5 K/uL   RBC 5.11 4.22 - 5.81 MIL/uL   Hemoglobin 15.9 13.0 - 17.0 g/dL   HCT 91.4 78.2 - 95.6 %   MCV 93.7 78.0 - 100.0 fL   MCH 31.1 26.0 - 34.0 pg   MCHC 33.2 30.0 - 36.0 g/dL   RDW 21.3 08.6 - 57.8 %   Platelets 240 150 - 400 K/uL  Troponin I  Result Value Ref Range   Troponin I <0.03 <0.03 ng/mL  Troponin I  Result Value Ref Range   Troponin I <0.03 <0.03 ng/mL   Dg Chest 2 View  Result Date: 05/27/2017 CLINICAL DATA:  Chest tightness for approximately 1 month EXAM: CHEST - 2 VIEW COMPARISON:  December 20, 2009 FINDINGS: Lungs are clear. The heart size and pulmonary vascularity are normal. No adenopathy. No pneumothorax. No bone lesions. IMPRESSION: No edema or consolidation. Electronically Signed   By: Bretta Bang III M.D.   On: 05/27/2017 08:19    EKG None  Radiology No  results found.  Procedures Procedures (including critical care time)  Medications Ordered in ED Medications  famotidine (PEPCID) tablet 20 mg (has no administration in time range)  gi cocktail (Maalox,Lidocaine,Donnatal) (has no administration in time range)  acetaminophen (TYLENOL) tablet 1,000 mg (has no administration in time range)     Initial Impression / Assessment and Plan / ED Course  I have reviewed the triage vital signs and  the nursing notes.  Pertinent labs & imaging results that were available during my care of the patient were reviewed by me and considered in my medical decision making (see chart for details).  Ecg. Labs. Cxr.   Reviewed nursing notes and prior charts for additional history.   Patient has noted recent reflux symptoms - pepcid, gi cocktail, acetaminophen given for symptom relief.  Awaiting labs.   Labs reviewed - trop normal.  xrays reviewed - no pna.  After symptoms present for past day, initial and delta troponin both normal and not increasing.   Pt notes hx anxiety, appears anxious in ED. Has ride/family here. Ativan 1 mg.  Recheck - no chest pain or increased wob/sob. As no active cp, initial and delta trop normal, vitals normal, pt currently appears stable for d/c.   Final Clinical Impressions(s) / ED Diagnoses   Final diagnoses:  None    ED Discharge Orders    None       Cathren LaineSteinl, Yeny Schmoll, MD 05/27/17 [phone]

## 2017-05-27 NOTE — ED Triage Notes (Signed)
Patient complains of left sided chest tightness that radiates to left shoulder. States some nausea this morning that has resolved. States nothing seems to make it better. States activity makes tightness and diaphoresis worse.

## 2017-09-20 ENCOUNTER — Emergency Department (HOSPITAL_COMMUNITY): Payer: Self-pay

## 2017-09-20 ENCOUNTER — Other Ambulatory Visit: Payer: Self-pay

## 2017-09-20 ENCOUNTER — Emergency Department (HOSPITAL_COMMUNITY)
Admission: EM | Admit: 2017-09-20 | Discharge: 2017-09-20 | Disposition: A | Discharge status: Home / Self Care | Payer: Self-pay | Attending: Emergency Medicine | Admitting: Emergency Medicine

## 2017-09-20 ENCOUNTER — Encounter (HOSPITAL_COMMUNITY): Payer: Self-pay | Admitting: Emergency Medicine

## 2017-09-20 DIAGNOSIS — W07XXXA Fall from chair, initial encounter: Secondary | ICD-10-CM | POA: Insufficient documentation

## 2017-09-20 DIAGNOSIS — S9031XA Contusion of right foot, initial encounter: Secondary | ICD-10-CM | POA: Insufficient documentation

## 2017-09-20 DIAGNOSIS — Z79899 Other long term (current) drug therapy: Secondary | ICD-10-CM | POA: Insufficient documentation

## 2017-09-20 DIAGNOSIS — I10 Essential (primary) hypertension: Secondary | ICD-10-CM | POA: Insufficient documentation

## 2017-09-20 DIAGNOSIS — R51 Headache: Secondary | ICD-10-CM | POA: Insufficient documentation

## 2017-09-20 DIAGNOSIS — F419 Anxiety disorder, unspecified: Secondary | ICD-10-CM | POA: Insufficient documentation

## 2017-09-20 DIAGNOSIS — Y998 Other external cause status: Secondary | ICD-10-CM | POA: Insufficient documentation

## 2017-09-20 DIAGNOSIS — Y9389 Activity, other specified: Secondary | ICD-10-CM | POA: Insufficient documentation

## 2017-09-20 DIAGNOSIS — F17210 Nicotine dependence, cigarettes, uncomplicated: Secondary | ICD-10-CM | POA: Insufficient documentation

## 2017-09-20 DIAGNOSIS — Y929 Unspecified place or not applicable: Secondary | ICD-10-CM | POA: Insufficient documentation

## 2017-09-20 MED ORDER — ACETAMINOPHEN ER 650 MG PO TBCR: 650.0000 mg | EXTENDED_RELEASE_TABLET | Freq: Three times a day (TID) | ORAL | 0 refills | Status: AC

## 2017-09-20 MED ORDER — IBUPROFEN 600 MG PO TABS: 600.0000 mg | ORAL_TABLET | Freq: Four times a day (QID) | ORAL | 0 refills | Status: DC | PRN

## 2017-09-20 MED ORDER — OMEPRAZOLE 20 MG PO CPDR: 20.0000 mg | DELAYED_RELEASE_CAPSULE | Freq: Two times a day (BID) | ORAL | 0 refills | Status: AC

## 2017-09-20 MED ORDER — OMEPRAZOLE 20 MG PO CPDR: 20.0000 mg | DELAYED_RELEASE_CAPSULE | Freq: Two times a day (BID) | ORAL | 0 refills | Status: DC

## 2017-09-20 MED ORDER — IBUPROFEN 600 MG PO TABS: 600.0000 mg | ORAL_TABLET | Freq: Four times a day (QID) | ORAL | 0 refills | Status: AC | PRN

## 2017-09-20 MED ORDER — NAPROXEN 250 MG PO TABS
500.0000 mg | ORAL_TABLET | Freq: Once | ORAL | Status: AC
  Administered 2017-09-20: 500 mg via ORAL
  Filled 2017-09-20: qty 2

## 2017-09-20 NOTE — ED Triage Notes (Signed)
Pt c/o right foot pain since dropping weight on it last night.

## 2017-09-20 NOTE — Discharge Instructions (Signed)
He was seen in the ER for foot injury.  X-ray does not reveal any fractures. We suspect that you have contusion. Treatment will be rest, ice, elevation.  We are sending you home with postop boot, which should give you some support when you are walking.  Weightbearing is allowed as tolerated, but we recommend that you give your feet some rest for the next couple of days so that it can heal properly.  If your symptoms continue at 2 weeks then follow-up with your doctor for repeat x-ray or orthopedic doctor.

## 2017-09-20 NOTE — ED Notes (Signed)
Post OP boot applied to right foot.

## 2017-09-20 NOTE — ED Notes (Signed)
Ice pack applied.

## 2017-09-20 NOTE — ED Provider Notes (Signed)
[facility] EMERGENCY DEPARTMENT Provider Note   CSN: [id number] Arrival date & time: 09/20/17  1884     History   Chief Complaint Chief Complaint  Patient presents with  . Foot Pain    HPI Eddie Cole is a 42 y.o. male.  HPI  42 year old male with history of peptic ulcer disease, hypertension, chronic pain, balance issues comes in with chief complaint of fall.  Level 5 caveat -patient is not a good historian, possibly due to TBI or alcohol/substance abuse.  Patient here with his cousin.  According to the cousin, patient called them for help, stating that he might have broken his foot.  At triage patient or his friend informed that patient dropped weight on himself last night.  To me patient states that he fell from chair.  Patient is complaining of foot pain without any numbness or tingling.  He denies any abdominal pain, chest pain, shortness of breath, new back pain.  Past Medical History:  Diagnosis Date  . Anxiety   . Balance problem   . Chronic pain    Hip/back  . HTN (hypertension)   . Nervousness   . PUD (peptic ulcer disease)     Patient Active Problem List   Diagnosis Date Noted  . Chest pain 09/19/2010  . Tobacco abuse 09/19/2010  . Hypertension 09/19/2010    Past Surgical History:  Procedure Laterality Date  . fx pelvis    . HIP SURGERY     ORIF  . JOINT REPLACEMENT          Home Medications    Prior to Admission medications   Medication Sig Start Date End Date Taking? Authorizing Provider  acetaminophen-codeine (TYLENOL #3) 300-30 MG per tablet Take 1-2 tablets by mouth every 6 (six) hours as needed for moderate pain. 11/18/13   Ivery Quale, PA-C  diazepam (VALIUM) 5 MG tablet Take 0.5-1 tablets by mouth 2 (two) times daily as needed. 05/02/17   [provider]  gabapentin (NEURONTIN) 100 MG capsule Take 100 mg by mouth 3 (three) times daily. 09/29/13 09/29/14  [provider]  omeprazole (PRILOSEC) 20 MG capsule Take  20 mg by mouth daily.    [provider]  quinapril (ACCUPRIL) 20 MG tablet Take 1 tablet by mouth daily. 09/27/13   [provider]  sucralfate (CARAFATE) 1 G tablet Take 1 g by mouth 2 (two) times daily.    [provider]  traMADol (ULTRAM) 50 MG tablet Take 1 tablet (50 mg total) by mouth every 6 (six) hours as needed. 07/21/16   Dione Booze, MD    Family History Family History  Problem Relation Age of Onset  . Neuropathy Mother   . Ulcers Mother   . Coronary artery disease Paternal Grandfather 92       MI  . Other Sister        stomach problems  . Emphysema Maternal Grandmother   . Congestive Heart Failure Maternal Grandfather   . Diabetes Paternal Grandmother     Social History Social History   Tobacco Use  . Smoking status: Current Every Day Smoker    Packs/day: 0.25    Years: 14.00    Pack years: 3.50    Types: Cigarettes  . Smokeless tobacco: Never Used  Substance Use Topics  . Alcohol use: Yes    Comment: occasionally  . Drug use: Yes    Types: Marijuana    Comment: occasionally     Allergies   Prednisone and Morphine  and related   Review of Systems Review of Systems  Unable to perform ROS: Mental status change     Physical Exam Updated Vital Signs BP (!) 139/93 (BP Location: Left Arm)   Pulse 83   Temp 97.8 F (36.6 C) (Oral)   Resp 20   Ht 5\' 10"  (1.778 m)   Wt 77.1 kg (170 lb)   SpO2 100%   BMI 24.39 kg/m   Physical Exam  Constitutional: He appears well-developed.  HENT:  Head: Atraumatic.  Eyes: Pupils are equal, round, and reactive to light. EOM are normal.  Neck: Neck supple.  No midline c-spine tenderness, pt able to turn head to 45 degrees bilaterally without any pain and able to flex neck to the chest and extend without any pain or neurologic symptoms.  Cardiovascular: Normal rate.  Pulmonary/Chest: Effort normal and breath sounds normal. No respiratory distress. He has no wheezes.  Abdominal: Soft.  There is no tenderness.  Musculoskeletal: He exhibits edema and tenderness. He exhibits no deformity.  Right foot has diffuse ecchymosis and edema.  Patient has tenderness to palpation over the dorsum of the right foot.  Ankle range of motion appears to be normal and there is no significant deformity.  Neurological:  Sleepy but arousable with sternal rub  Skin: Skin is warm. Rash noted.  Nursing note and vitals reviewed.    ED Treatments / Results  Labs (all labs ordered are listed, but only abnormal results are displayed) Labs Reviewed - No data to display  EKG None  Radiology Ct Head Wo Contrast  Result Date: 09/20/2017 CLINICAL DATA:  Posttraumatic headache. Slurred speech. Difficult to keep awake. EXAM: CT HEAD WITHOUT CONTRAST TECHNIQUE: Contiguous axial images were obtained from the base of the skull through the vertex without intravenous contrast. COMPARISON:  CT maxillofacial 11/27/2015. CT cervical spine 12/14/2013. FINDINGS: Brain: Mild cerebral atrophy for age. No ventricular dilatation. No mass effect or midline shift. No abnormal extra-axial fluid collections. Gray-white matter junctions are distinct. Basal cisterns are not effaced. No acute intracranial hemorrhage. Vascular: No hyperdense vessel or unexpected calcification. Skull: Normal. Negative for fracture or focal lesion. Sinuses/Orbits: Mucosal thickening in the right maxillary antrum. Paranasal sinuses and mastoid air cells are otherwise clear. Old nasal bone fractures. Other: Congenital nonunion of the posterior arch of C1. IMPRESSION: No acute intracranial abnormalities. Electronically Signed   By: Burman NievesWilliam  Stevens M.D.   On: 09/20/2017 06:02   Dg Foot Complete Right  Result Date: 09/20/2017 CLINICAL DATA:  Abrasion.  Dropped weight on foot. EXAM: RIGHT FOOT COMPLETE - 3+ VIEW COMPARISON:  None. FINDINGS: There is no evidence of fracture or dislocation. There is no evidence of arthropathy or other focal bone  abnormality. Soft tissues are unremarkable. IMPRESSION: Negative. Electronically Signed   By: Awilda Metroourtnay  Bloomer M.D.   On: 09/20/2017 05:16    Procedures Procedures (including critical care time)  Medications Ordered in ED Medications  naproxen (NAPROSYN) tablet 500 mg (500 mg Oral Given 09/20/17 [id number])     Initial Impression / Assessment and Plan / ED Course  I have reviewed the triage vital signs and the nursing notes.  Pertinent labs & imaging results that were available during my care of the patient were reviewed by me and considered in my medical decision making (see chart for details).     42 year old male comes in with chief complaint of foot pain. Patient is noted to have right-sided foot ecchymosis along with edema.  It seems like he had blunt trauma -  x-ray ordered and does not reveal any acute fracture or dislocation.  We will place patient in a postop boot.  He has ambulated according to family members, therefore we will allow him to weight-bear as tolerated.  Ankle appears to be intact.  Additionally, CT head ordered to ensure there is no brain bleed given that patient is not able to provide meaningful history.  If CT head is normal then he could have postconcussion syndrome, versus substance-induced somnolence.  Patient does smell of marijuana and family reports history of heavy drinking.  Final Clinical Impressions(s) / ED Diagnoses   Final diagnoses:  Contusion of right foot, initial encounter    ED Discharge Orders    None       Derwood Kaplan, MD 09/20/17 [id number]

## 2022-05-01 ENCOUNTER — Ambulatory Visit (HOSPITAL_COMMUNITY)
Admission: RE | Admit: 2022-05-01 | Discharge: 2022-05-01 | Disposition: A | Discharge status: Home / Self Care | Payer: Self-pay | Source: Ambulatory Visit | Attending: Family | Admitting: Family

## 2022-05-01 ENCOUNTER — Other Ambulatory Visit (HOSPITAL_COMMUNITY): Payer: Self-pay | Admitting: Family

## 2022-05-01 DIAGNOSIS — M25552 Pain in left hip: Secondary | ICD-10-CM | POA: Insufficient documentation

## 2022-05-01 DIAGNOSIS — M25551 Pain in right hip: Secondary | ICD-10-CM | POA: Insufficient documentation

## 2022-12-14 ENCOUNTER — Ambulatory Visit (HOSPITAL_COMMUNITY)
Admission: RE | Admit: 2022-12-14 | Discharge: 2022-12-14 | Disposition: A | Discharge status: Home / Self Care | Payer: 59 | Source: Ambulatory Visit | Attending: Family | Admitting: Family

## 2022-12-14 ENCOUNTER — Other Ambulatory Visit (HOSPITAL_COMMUNITY): Payer: Self-pay | Admitting: Family

## 2022-12-14 DIAGNOSIS — M542 Cervicalgia: Secondary | ICD-10-CM | POA: Insufficient documentation
